# Patient Record
Sex: Female | Born: 1975 | Race: Black or African American | Hispanic: No | Marital: Single | Smoking: Current every day smoker
Health system: Southern US, Community
[De-identification: ages and names within clinical notes are randomized; demographics above are authoritative.]

## PROBLEM LIST (undated history)

## (undated) DIAGNOSIS — I10 Essential (primary) hypertension: Secondary | ICD-10-CM

## (undated) DIAGNOSIS — F419 Anxiety disorder, unspecified: Secondary | ICD-10-CM

## (undated) DIAGNOSIS — F32A Depression, unspecified: Secondary | ICD-10-CM

## (undated) HISTORY — DX: Depression, unspecified: F32.A

## (undated) HISTORY — DX: Anxiety disorder, unspecified: F41.9

---

## 2004-02-21 ENCOUNTER — Emergency Department: Payer: Self-pay | Admitting: General Practice

## 2004-06-11 ENCOUNTER — Emergency Department: Payer: Self-pay | Admitting: Emergency Medicine

## 2004-06-22 ENCOUNTER — Emergency Department: Payer: Self-pay | Admitting: Emergency Medicine

## 2004-06-23 ENCOUNTER — Ambulatory Visit: Payer: Self-pay | Admitting: Emergency Medicine

## 2004-07-03 ENCOUNTER — Emergency Department: Payer: Self-pay | Admitting: Emergency Medicine

## 2004-11-16 ENCOUNTER — Emergency Department: Payer: Self-pay | Admitting: Unknown Physician Specialty

## 2005-04-27 ENCOUNTER — Emergency Department: Payer: Self-pay | Admitting: Emergency Medicine

## 2005-04-27 ENCOUNTER — Other Ambulatory Visit: Payer: Self-pay

## 2005-09-27 ENCOUNTER — Emergency Department: Payer: Self-pay | Admitting: Internal Medicine

## 2005-09-28 ENCOUNTER — Ambulatory Visit: Payer: Self-pay | Admitting: Internal Medicine

## 2007-04-25 ENCOUNTER — Emergency Department: Payer: Self-pay | Admitting: Emergency Medicine

## 2015-04-07 LAB — HM PAP SMEAR: HM Pap smear: NEGATIVE

## 2016-09-18 ENCOUNTER — Encounter: Payer: Self-pay | Admitting: Obstetrics and Gynecology

## 2016-09-19 ENCOUNTER — Ambulatory Visit (INDEPENDENT_AMBULATORY_CARE_PROVIDER_SITE_OTHER): Payer: Self-pay | Admitting: Obstetrics and Gynecology

## 2016-09-19 DIAGNOSIS — Z5329 Procedure and treatment not carried out because of patient's decision for other reasons: Secondary | ICD-10-CM

## 2016-11-08 NOTE — Progress Notes (Signed)
No show

## 2017-08-16 ENCOUNTER — Ambulatory Visit: Payer: Self-pay | Admitting: Family Medicine

## 2017-08-16 DIAGNOSIS — Z0289 Encounter for other administrative examinations: Secondary | ICD-10-CM

## 2017-08-16 NOTE — Progress Notes (Deleted)
   Subjective:    Patient ID: Nancy Ritter, female    DOB: 05/20/1975, 42 y.o.   MRN: [id number]  HPI  Presents to clinic to establish primary care  Review of Systems  Constitutional: Negative for chills, fatigue and fever.  HENT: Negative for congestion, ear pain, sinus pain and sore throat.   Eyes: Negative.   Respiratory: Negative for cough, shortness of breath and wheezing.   Cardiovascular: Negative for chest pain, palpitations and leg swelling.  Gastrointestinal: Negative for abdominal pain, diarrhea, nausea and vomiting.  Genitourinary: Negative for dysuria, frequency and urgency.  Musculoskeletal: Negative for arthralgias and myalgias.  Skin: Negative for color change, pallor and rash.  Neurological: Negative for syncope, light-headedness and headaches.  Psychiatric/Behavioral: The patient is not nervous/anxious.       Objective:   Physical Exam  Constitutional: She is oriented to person, place, and time. She appears well-developed and well-nourished. No distress.  HENT:  Head: Normocephalic and atraumatic.  Eyes: Pupils are equal, round, and reactive to light. EOM are normal. No scleral icterus.  Neck: Normal range of motion. Neck supple. No tracheal deviation present.  Cardiovascular: Normal rate, regular rhythm and normal heart sounds.  Pulmonary/Chest: Effort normal and breath sounds normal. No respiratory distress. She has no wheezes. She has no rales.  Abdominal: Soft. Bowel sounds are normal. There is no tenderness.  Neurological: She is alert and oriented to person, place, and time.  Gait normal  Skin: Skin is warm and dry. No pallor.  Psychiatric: She has a normal mood and affect. Her behavior is normal. Thought content normal.  Nursing note and vitals reviewed.      Assessment & Plan:

## 2017-12-17 ENCOUNTER — Other Ambulatory Visit: Payer: Self-pay | Admitting: Certified Nurse Midwife

## 2017-12-17 DIAGNOSIS — Z1231 Encounter for screening mammogram for malignant neoplasm of breast: Secondary | ICD-10-CM

## 2018-01-07 ENCOUNTER — Inpatient Hospital Stay: Admission: RE | Admit: 2018-01-07 | Payer: Medicaid Other | Source: Ambulatory Visit

## 2020-07-28 ENCOUNTER — Emergency Department
Admission: EM | Admit: 2020-07-28 | Discharge: 2020-07-28 | Disposition: A | Discharge status: Home / Self Care | Payer: Self-pay | Attending: Emergency Medicine | Admitting: Emergency Medicine

## 2020-07-28 DIAGNOSIS — M549 Dorsalgia, unspecified: Secondary | ICD-10-CM | POA: Insufficient documentation

## 2020-07-28 DIAGNOSIS — F17210 Nicotine dependence, cigarettes, uncomplicated: Secondary | ICD-10-CM | POA: Insufficient documentation

## 2020-07-28 DIAGNOSIS — Y92410 Unspecified street and highway as the place of occurrence of the external cause: Secondary | ICD-10-CM | POA: Insufficient documentation

## 2020-07-28 DIAGNOSIS — R519 Headache, unspecified: Secondary | ICD-10-CM | POA: Insufficient documentation
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
## 2020-07-28 NOTE — ED Notes (Addendum)
Attempted to reassess vital signs, pt not in ED room 41 at present time. ED provider aware.

## 2020-07-30 ENCOUNTER — Emergency Department: Payer: No Typology Code available for payment source

## 2020-07-30 ENCOUNTER — Emergency Department
Admission: EM | Admit: 2020-07-30 | Discharge: 2020-07-30 | Disposition: A | Discharge status: Home / Self Care | Payer: No Typology Code available for payment source | Attending: Emergency Medicine | Admitting: Emergency Medicine

## 2020-07-30 ENCOUNTER — Other Ambulatory Visit: Payer: Self-pay

## 2020-07-30 DIAGNOSIS — I10 Essential (primary) hypertension: Secondary | ICD-10-CM | POA: Diagnosis not present

## 2020-07-30 DIAGNOSIS — I160 Hypertensive urgency: Secondary | ICD-10-CM | POA: Insufficient documentation

## 2020-07-30 DIAGNOSIS — Z79899 Other long term (current) drug therapy: Secondary | ICD-10-CM | POA: Insufficient documentation

## 2020-07-30 DIAGNOSIS — M545 Low back pain, unspecified: Secondary | ICD-10-CM | POA: Insufficient documentation

## 2020-07-30 DIAGNOSIS — M546 Pain in thoracic spine: Secondary | ICD-10-CM | POA: Insufficient documentation

## 2020-07-30 DIAGNOSIS — Y92410 Unspecified street and highway as the place of occurrence of the external cause: Secondary | ICD-10-CM | POA: Diagnosis not present

## 2020-07-30 DIAGNOSIS — F17210 Nicotine dependence, cigarettes, uncomplicated: Secondary | ICD-10-CM | POA: Diagnosis not present

## 2020-07-30 HISTORY — DX: Essential (primary) hypertension: I10

## 2020-07-30 LAB — CBC WITH DIFFERENTIAL/PLATELET
Abs Immature Granulocytes: 0.01 10*3/uL (ref 0.00–0.07)
Basophils Absolute: 0 10*3/uL (ref 0.0–0.1)
Basophils Relative: 1 %
Eosinophils Absolute: 0.1 10*3/uL (ref 0.0–0.5)
Eosinophils Relative: 2 %
HCT: 38.1 % (ref 36.0–46.0)
Hemoglobin: 14.2 g/dL (ref 12.0–15.0)
Immature Granulocytes: 0 %
Lymphocytes Relative: 35 %
Lymphs Abs: 1.6 10*3/uL (ref 0.7–4.0)
MCH: 31.2 pg (ref 26.0–34.0)
MCHC: 37.3 g/dL — ABNORMAL HIGH (ref 30.0–36.0)
MCV: 83.7 fL (ref 80.0–100.0)
Monocytes Absolute: 0.4 10*3/uL (ref 0.1–1.0)
Monocytes Relative: 7 %
Neutro Abs: 2.6 10*3/uL (ref 1.7–7.7)
Neutrophils Relative %: 55 %
Platelets: 230 10*3/uL (ref 150–400)
RBC: 4.55 MIL/uL (ref 3.87–5.11)
RDW: 13.6 % (ref 11.5–15.5)
WBC: 4.7 10*3/uL (ref 4.0–10.5)
nRBC: 0 % (ref 0.0–0.2)

## 2020-07-30 LAB — COMPREHENSIVE METABOLIC PANEL
ALT: 46 U/L — ABNORMAL HIGH (ref 0–44)
AST: 62 U/L — ABNORMAL HIGH (ref 15–41)
Albumin: 3.8 g/dL (ref 3.5–5.0)
Alkaline Phosphatase: 52 U/L (ref 38–126)
Anion gap: 10 (ref 5–15)
BUN: 8 mg/dL (ref 6–20)
CO2: 27 mmol/L (ref 22–32)
Calcium: 9 mg/dL (ref 8.9–10.3)
Chloride: 99 mmol/L (ref 98–111)
Creatinine, Ser: 0.59 mg/dL (ref 0.44–1.00)
GFR, Estimated: >60 mL/min (ref >60)
Glucose, Bld: 83 mg/dL (ref 70–99)
Potassium: 3.6 mmol/L (ref 3.5–5.1)
Sodium: 136 mmol/L (ref 135–145)
Total Bilirubin: 0.7 mg/dL (ref 0.3–1.2)
Total Protein: 7.3 g/dL (ref 6.5–8.1)

## 2020-07-30 LAB — POC URINE PREG, ED: Preg Test, Ur: NEGATIVE

## 2020-07-30 MED ORDER — MELOXICAM 7.5 MG PO TABS
15.0000 mg | ORAL_TABLET | Freq: Once | ORAL | Status: AC
  Administered 2020-07-30: 15 mg via ORAL

## 2020-07-30 MED ORDER — ACETAMINOPHEN 325 MG PO TABS
650.0000 mg | ORAL_TABLET | Freq: Once | ORAL | Status: AC
  Administered 2020-07-30: 650 mg via ORAL
  Filled 2020-07-30: qty 2

## 2020-07-30 MED ORDER — MELOXICAM 15 MG PO TABS: 15.0000 mg | ORAL_TABLET | Freq: Every day | ORAL | 0 refills | Status: AC

## 2020-07-30 MED ORDER — AMLODIPINE BESYLATE 5 MG PO TABS
5.0000 mg | ORAL_TABLET | Freq: Once | ORAL | Status: AC
  Administered 2020-07-30: 5 mg via ORAL
  Filled 2020-07-30: qty 1

## 2020-07-30 MED ORDER — METHOCARBAMOL 750 MG PO TABS: 750.0000 mg | ORAL_TABLET | Freq: Four times a day (QID) | ORAL | 0 refills | Status: AC | PRN

## 2020-07-30 MED ORDER — OXYCODONE-ACETAMINOPHEN 5-325 MG PO TABS
1.0000 | ORAL_TABLET | Freq: Once | ORAL | Status: AC
  Administered 2020-07-30: 1 via ORAL
  Filled 2020-07-30: qty 1

## 2020-07-30 MED ORDER — AMLODIPINE BESYLATE 5 MG PO TABS: 5.0000 mg | ORAL_TABLET | Freq: Every day | ORAL | 0 refills | Status: DC

## 2020-07-30 MED ORDER — METHOCARBAMOL 500 MG PO TABS
750.0000 mg | ORAL_TABLET | Freq: Once | ORAL | Status: AC
  Administered 2020-07-30: 750 mg via ORAL
  Filled 2020-07-30: qty 2

## 2020-07-30 NOTE — Discharge Instructions (Addendum)
You have been prescribed Robaxin, a muscle relaxer which you may take up to 4 times daily.  You have also been prescribed Mobic, an anti-inflammatory to take once daily.  You may safely combine these with Tylenol, up to 1000 mg 4 times daily as needed for pain.  In regards to your hypertension, you have been prescribed amlodipine to take once daily over the next 30 days.  Please schedule a follow-up with a primary care provider to discuss further care for your hypertension.  Return to the emergency department with any worsening of symptoms.
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
## 2020-11-25 ENCOUNTER — Other Ambulatory Visit: Payer: Self-pay

## 2020-11-25 ENCOUNTER — Observation Stay
Admission: EM | Admit: 2020-11-25 | Discharge: 2020-11-26 | Disposition: A | Discharge status: Home / Self Care | Payer: Self-pay | Attending: Emergency Medicine | Admitting: Emergency Medicine

## 2020-11-25 ENCOUNTER — Emergency Department: Payer: Self-pay

## 2020-11-25 ENCOUNTER — Encounter: Payer: Self-pay | Admitting: Emergency Medicine

## 2020-11-25 DIAGNOSIS — I160 Hypertensive urgency: Secondary | ICD-10-CM

## 2020-11-25 DIAGNOSIS — R7989 Other specified abnormal findings of blood chemistry: Secondary | ICD-10-CM

## 2020-11-25 DIAGNOSIS — I10 Essential (primary) hypertension: Secondary | ICD-10-CM

## 2020-11-25 DIAGNOSIS — R778 Other specified abnormalities of plasma proteins: Secondary | ICD-10-CM

## 2020-11-25 DIAGNOSIS — F17210 Nicotine dependence, cigarettes, uncomplicated: Secondary | ICD-10-CM | POA: Insufficient documentation

## 2020-11-25 DIAGNOSIS — R748 Abnormal levels of other serum enzymes: Secondary | ICD-10-CM | POA: Insufficient documentation

## 2020-11-25 DIAGNOSIS — R079 Chest pain, unspecified: Principal | ICD-10-CM | POA: Diagnosis present

## 2020-11-25 DIAGNOSIS — Z20822 Contact with and (suspected) exposure to covid-19: Secondary | ICD-10-CM | POA: Insufficient documentation

## 2020-11-25 HISTORY — DX: Other specified abnormal findings of blood chemistry: R79.89

## 2020-11-25 LAB — CBC
HCT: 42.4 % (ref 36.0–46.0)
HCT: 38 % (ref 36.0–46.0)
Hemoglobin: 15.5 g/dL — ABNORMAL HIGH (ref 12.0–15.0)
Hemoglobin: 14.1 g/dL (ref 12.0–15.0)
MCH: 30.7 pg (ref 26.0–34.0)
MCH: 31.1 pg (ref 26.0–34.0)
MCHC: 36.6 g/dL — ABNORMAL HIGH (ref 30.0–36.0)
MCHC: 37.1 g/dL — ABNORMAL HIGH (ref 30.0–36.0)
MCV: 84 fL (ref 80.0–100.0)
MCV: 83.7 fL (ref 80.0–100.0)
Platelets: 265 10*3/uL (ref 150–400)
Platelets: 238 10*3/uL (ref 150–400)
RBC: 5.05 MIL/uL (ref 3.87–5.11)
RBC: 4.54 MIL/uL (ref 3.87–5.11)
RDW: 12.6 % (ref 11.5–15.5)
RDW: 12.4 % (ref 11.5–15.5)
WBC: 6.5 10*3/uL (ref 4.0–10.5)
WBC: 5 10*3/uL (ref 4.0–10.5)
nRBC: 0 % (ref 0.0–0.2)
nRBC: 0 % (ref 0.0–0.2)

## 2020-11-25 LAB — BASIC METABOLIC PANEL
Anion gap: 6 (ref 5–15)
BUN: 10 mg/dL (ref 6–20)
CO2: 26 mmol/L (ref 22–32)
Calcium: 9.2 mg/dL (ref 8.9–10.3)
Chloride: 103 mmol/L (ref 98–111)
Creatinine, Ser: 0.73 mg/dL (ref 0.44–1.00)
GFR, Estimated: >60 mL/min (ref >60)
Glucose, Bld: 95 mg/dL (ref 70–99)
Potassium: 4.1 mmol/L (ref 3.5–5.1)
Sodium: 135 mmol/L (ref 135–145)

## 2020-11-25 LAB — RESP PANEL BY RT-PCR (FLU A&B, COVID) ARPGX2
Influenza A by PCR: NEGATIVE
Influenza B by PCR: NEGATIVE
SARS Coronavirus 2 by RT PCR: NEGATIVE

## 2020-11-25 LAB — CREATININE, SERUM
Creatinine, Ser: 0.65 mg/dL (ref 0.44–1.00)
GFR, Estimated: >60 mL/min (ref >60)

## 2020-11-25 LAB — TROPONIN I (HIGH SENSITIVITY)
Troponin I (High Sensitivity): 7 ng/L (ref <18)
Troponin I (High Sensitivity): 19 ng/L — ABNORMAL HIGH (ref <18)
Troponin I (High Sensitivity): 11 ng/L (ref <18)

## 2020-11-25 MED ORDER — ASPIRIN EC 81 MG PO TBEC
81.0000 mg | DELAYED_RELEASE_TABLET | Freq: Every day | ORAL | Status: DC
  Administered 2020-11-26: 81 mg via ORAL
  Filled 2020-11-25: qty 1

## 2020-11-25 MED ORDER — AMLODIPINE BESYLATE 5 MG PO TABS
5.0000 mg | ORAL_TABLET | Freq: Every day | ORAL | Status: DC
  Administered 2020-11-26: 5 mg via ORAL
  Filled 2020-11-25: qty 1

## 2020-11-25 MED ORDER — CLONIDINE HCL 0.1 MG PO TABS
0.2000 mg | ORAL_TABLET | Freq: Once | ORAL | Status: AC
  Administered 2020-11-25: 0.2 mg via ORAL
  Filled 2020-11-25: qty 2

## 2020-11-25 MED ORDER — ENOXAPARIN SODIUM 40 MG/0.4ML IJ SOSY
40.0000 mg | PREFILLED_SYRINGE | INTRAMUSCULAR | Status: DC
  Administered 2020-11-26: 40 mg via SUBCUTANEOUS
  Filled 2020-11-25: qty 0.4

## 2020-11-25 MED ORDER — NITROGLYCERIN 0.4 MG SL SUBL: 0.4000 mg | SUBLINGUAL_TABLET | SUBLINGUAL | Status: DC | PRN

## 2020-11-25 MED ORDER — ASPIRIN 81 MG PO CHEW
162.0000 mg | CHEWABLE_TABLET | Freq: Once | ORAL | Status: AC
  Administered 2020-11-25: 162 mg via ORAL
  Filled 2020-11-25: qty 2

## 2020-11-25 NOTE — ED Provider Notes (Signed)
Emergency Medicine Provider Triage Evaluation Note  Nancy Ritter , a 45 y.o. female  was evaluated in triage.  Pt complains of chest pain and numbness/tingling in left arm.  Review of Systems  Positive: Chest pain, numbness tingling in left arm Negative: No fever, chills, cough or congestion, no vomiting/diarrhea  Physical Exam  BP (!) 183/130 (BP Location: Left Arm)   Pulse 81   Temp 98.5 F (36.9 C) (Oral)   Resp 20   Ht 5\' 6"  (1.676 m)   Wt 63.5 kg   LMP 11/03/2020   SpO2 98%   BMI 22.60 kg/m  Gen:   Awake, no distress   Resp:  Normal effort  MSK:   Moves extremities without difficulty  Other:    Medical Decision Making  Medically screening exam initiated at 1:00 PM.  Appropriate orders placed.  13/03/2020 was informed that the remainder of the evaluation will be completed by another provider, this initial triage assessment does not replace that evaluation, and the importance of remaining in the ED until their evaluation is complete.     [name], PA-C 11/25/20 1300    11/27/20, MD 11/25/20 1451
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
## 2020-11-25 NOTE — ED Triage Notes (Signed)
Pt reports her BP has been running high and she is having intermittent CP that is sharp in nature and makes her left arm numb and tingly

## 2020-11-25 NOTE — H&P (Signed)
History and Physical  Nancy Ritter [id number] DOB: 1975-01-06 DOA: 11/25/2020  Referring physician: Arta Silence, MD PCP: Pcp, No  Patient coming from: Home  Chief Complaint: Chest pain and elevated BP  HPI: Nancy Ritter is a 45 y.o. female with medical history significant for hypertension (not on any medication) who presents to the emergency department due to midsternal, nonreproducible sharp chest pain with radiation to left arm, it was rated as 8/10 on pain scale which started this morning prior to going to work, chest pain was alleviated with sitting still and taking deep breaths, chest pain subsequently resolved and she went to work, she had a recurrence of same chest pain just about clocking in at work and this was also relieved with sitting still and taking deep breath, unfortunately, this occurred the 3rd time and that BP was checked at work and noted to be elevated, EMS was activated and patient was taken to the ED for further evaluation and management.  ED Course:  In the emergency department, BP was elevated at 186/111, but other vital signs were within normal range.  Work-up in the ED showed normal CBC except for elevated hemoglobin at 15.5, normal BMP, troponin x2 - 7 > 19. Chest x-ray showed no acute intrathoracic process.  Aspirin 160 mg x 1 was given, clonidine 0.2 mg x 1 was given.  Chest pain already resolved.  Hospitalist was asked to admit patient for further evaluation and management.  Review of Systems: Constitutional: Negative for chills and fever.  HENT: Negative for ear pain and sore throat.   Eyes: Negative for pain and visual disturbance.  Respiratory: Negative for cough, chest tightness and shortness of breath.   Cardiovascular: Positive for chest pain and negative for palpitations.  Gastrointestinal: Negative for abdominal pain and vomiting.  Endocrine: Negative for polyphagia and polyuria.  Genitourinary: Negative for decreased urine volume, dysuria,  enuresis Musculoskeletal: Negative for arthralgias and back pain.  Skin: Negative for color change and rash.  Allergic/Immunologic: Negative for immunocompromised state.  Neurological: Negative for tremors, syncope, speech difficulty, weakness, light-headedness  Hematological: Does not bruise/bleed easily.  All other systems reviewed and are negative   Past Medical History:  Diagnosis Date   Hypertension    History reviewed. No pertinent surgical history.  Social History:  reports that she has been smoking cigarettes. She has never used smokeless tobacco. She reports current alcohol use. No history on file for drug use.   No Known Allergies  Family history: Mother: Deceased, had a history of stroke and hypertension Father: Alive, has a history of hypertension  Prior to Admission medications   Medication Sig Start Date End Date Taking? Authorizing Provider  amLODipine (NORVASC) 5 MG tablet Take 1 tablet (5 mg total) by mouth daily. 07/30/20 08/29/20  Marlana Salvage, PA    Physical Exam: BP 140/87 (BP Location: Left Arm)   Pulse 60   Temp 98.4 F (36.9 C) (Oral)   Resp 18   Ht 5\' 6"  (1.676 m)   Wt 63.5 kg   LMP 11/03/2020   SpO2 100%   BMI 22.60 kg/m   General: 45 y.o. year-old female well developed well nourished in no acute distress.  Alert and oriented x3. HEENT: NCAT, EOMI Neck: Supple, trachea medial Cardiovascular: Regular rate and rhythm with no rubs or gallops.  No thyromegaly or JVD noted.  No lower extremity edema. 2/4 pulses in all 4 extremities. Respiratory: Clear to auscultation with no wheezes or rales. Good inspiratory effort. Abdomen:  Soft, nontender nondistended with normal bowel sounds x4 quadrants. Muskuloskeletal: No cyanosis, clubbing or edema noted bilaterally Neuro: CN II-XII intact, strength 5/5 x 4, sensation, reflexes intact Skin: No ulcerative lesions noted or rashes Psychiatry: Judgement and insight appear normal. Mood is appropriate for  condition and setting          Labs on Admission:  Basic Metabolic Panel: Recent Labs  Lab 11/25/20 1316  NA 135  K 4.1  CL 103  CO2 26  GLUCOSE 95  BUN 10  CREATININE 0.73  CALCIUM 9.2   Liver Function Tests: No results for input(s): AST, ALT, ALKPHOS, BILITOT, PROT, ALBUMIN in the last 168 hours. No results for input(s): LIPASE, AMYLASE in the last 168 hours. No results for input(s): AMMONIA in the last 168 hours. CBC: Recent Labs  Lab 11/25/20 1316  WBC 6.5  HGB 15.5*  HCT 42.4  MCV 84.0  PLT 265   Cardiac Enzymes: No results for input(s): CKTOTAL, CKMB, CKMBINDEX, TROPONINI in the last 168 hours.  BNP (last 3 results) No results for input(s): BNP in the last 8760 hours.  ProBNP (last 3 results) No results for input(s): PROBNP in the last 8760 hours.  CBG: No results for input(s): GLUCAP in the last 168 hours.  Radiological Exams on Admission: DG Chest 2 View  Result Date: 11/25/2020 CLINICAL DATA:  Chest pain EXAM: CHEST - 2 VIEW COMPARISON:  Chest x-ray 04/27/2005 FINDINGS: Heart size and mediastinal contours are within normal limits. No suspicious pulmonary opacities identified. No pleural effusion or pneumothorax visualized. No acute osseous abnormality appreciated. IMPRESSION: No acute intrathoracic process identified. Electronically Signed   By: Jannifer Hick M.D.   On: 11/25/2020 13:30    EKG: I independently viewed the EKG done and my findings are as followed: Normal sinus rhythm at a rate of 77 bpm  Assessment/Plan Present on Admission:  Chest pain  Principal Problem:   Chest pain   Chest pain rule out ACS Cardiovascular risk factors include hypertension Elevated troponin possibly due to to type II demand ischemia Continue telemetry  Troponins x2 -7 > 19, continue to trend troponin EKG showed normal sinus rhythm  Cardiology will be consulted to help decide if Stress test is needed in am Versus other diagnostic modalities.    Give  aspirin, nitroglycerin prn  Hypertensive urgency Essential hypertension (uncontrolled) Patient with history of hypertension but was not taking any medication for BP, possibly due to not having a PCP Clonidine 0.2 mg x 1 was given Continue amlodipine 5 mg p.o. daily  DVT prophylaxis: Lovenox  Code Status: Full code  Family Communication: None at bedside  Disposition Plan:  Patient is from:                        home Anticipated DC to:                   SNF or family members home Anticipated DC date:               2-3 days Anticipated DC barriers:          Patient requires inpatient management due to chest pain requiring pending cardiology consult  Consults called: Cardiology  Admission status:      Frankey Shown MD [facility]  11/25/2020, 10:04 PM

## 2020-11-26 ENCOUNTER — Encounter: Payer: Self-pay | Admitting: Internal Medicine

## 2020-11-26 LAB — CBC
HCT: 39.2 % (ref 36.0–46.0)
Hemoglobin: 14.3 g/dL (ref 12.0–15.0)
MCH: 30 pg (ref 26.0–34.0)
MCHC: 36.5 g/dL — ABNORMAL HIGH (ref 30.0–36.0)
MCV: 82.4 fL (ref 80.0–100.0)
Platelets: 248 10*3/uL (ref 150–400)
RBC: 4.76 MIL/uL (ref 3.87–5.11)
RDW: 12.3 % (ref 11.5–15.5)
WBC: 4.9 10*3/uL (ref 4.0–10.5)
nRBC: 0 % (ref 0.0–0.2)

## 2020-11-26 LAB — LIPID PANEL
Cholesterol: 185 mg/dL (ref 0–200)
HDL: 44 mg/dL (ref >40)
LDL Cholesterol: 126 mg/dL — ABNORMAL HIGH (ref 0–99)
Total CHOL/HDL Ratio: 4.2 RATIO
Triglycerides: 73 mg/dL (ref <150)
VLDL: 15 mg/dL (ref 0–40)

## 2020-11-26 LAB — COMPREHENSIVE METABOLIC PANEL
ALT: 11 U/L (ref 0–44)
AST: 13 U/L — ABNORMAL LOW (ref 15–41)
Albumin: 3.5 g/dL (ref 3.5–5.0)
Alkaline Phosphatase: 41 U/L (ref 38–126)
Anion gap: 6 (ref 5–15)
BUN: 9 mg/dL (ref 6–20)
CO2: 25 mmol/L (ref 22–32)
Calcium: 9 mg/dL (ref 8.9–10.3)
Chloride: 101 mmol/L (ref 98–111)
Creatinine, Ser: 0.65 mg/dL (ref 0.44–1.00)
GFR, Estimated: >60 mL/min (ref >60)
Glucose, Bld: 90 mg/dL (ref 70–99)
Potassium: 3.4 mmol/L — ABNORMAL LOW (ref 3.5–5.1)
Sodium: 132 mmol/L — ABNORMAL LOW (ref 135–145)
Total Bilirubin: 0.7 mg/dL (ref 0.3–1.2)
Total Protein: 6.6 g/dL (ref 6.5–8.1)

## 2020-11-26 LAB — APTT: aPTT: 34 seconds (ref 24–36)

## 2020-11-26 LAB — PHOSPHORUS: Phosphorus: 3.8 mg/dL (ref 2.5–4.6)

## 2020-11-26 LAB — TSH: TSH: 1.895 u[IU]/mL (ref 0.350–4.500)

## 2020-11-26 LAB — TROPONIN I (HIGH SENSITIVITY): Troponin I (High Sensitivity): 10 ng/L (ref <18)

## 2020-11-26 LAB — HIV ANTIBODY (ROUTINE TESTING W REFLEX): HIV Screen 4th Generation wRfx: NONREACTIVE

## 2020-11-26 LAB — MAGNESIUM: Magnesium: 1.9 mg/dL (ref 1.7–2.4)

## 2020-11-26 MED ORDER — AMLODIPINE BESYLATE 5 MG PO TABS: 5.0000 mg | ORAL_TABLET | Freq: Every day | ORAL | 3 refills | Status: DC

## 2020-11-26 NOTE — ED Notes (Signed)
Patient denies pain and is resting comfortably.  

## 2020-11-26 NOTE — Discharge Summary (Signed)
Physician Discharge Summary   Patient name: Nancy Ritter  Admit date:     11/25/2020  Discharge date: 11/26/2020  Discharge Physician: Lewie Chamber   PCP: Pcp, No   Recommendations at discharge:  Follow up with cardiology Establish with primary care Follow up A1c  Discharge Diagnoses Principal Problem:   Chest pain Active Problems:   Hypertensive urgency   Essential hypertension   Elevated troponin   Resolved Diagnoses Resolved Problems:   * No resolved hospital problems. [facility] Course   Nancy Ritter is a 45 yo female with PMH HTN who presented to the ER with CP and elevated BP. She is on no meds at home PTA and was found to have BP 186/111.  She was admitted for workup.  EKG showed subtle changes likely from her uncontrolled BP. Troponin was trended and remained negative from an ACS standpoint. Her BP responded to clonidine on admission and she was continued on amlodipine at discharge. SW was consulted for assistance with outpatient PCP arrangement and she was also evaluated by cardiology inpatient and will follow up outpatient as well.     Procedures performed: n/a   Condition at discharge: stable  Exam Physical Exam Constitutional:      Appearance: Normal appearance.  HENT:     Head: Normocephalic and atraumatic.     Mouth/Throat:     Mouth: Mucous membranes are moist.  Eyes:     Extraocular Movements: Extraocular movements intact.  Cardiovascular:     Rate and Rhythm: Normal rate and regular rhythm.     Heart sounds: Normal heart sounds.  Pulmonary:     Effort: Pulmonary effort is normal. No respiratory distress.     Breath sounds: Normal breath sounds.  Abdominal:     General: Bowel sounds are normal. There is no distension.     Palpations: Abdomen is soft.     Tenderness: There is no abdominal tenderness.  Musculoskeletal:        General: Normal range of motion.     Cervical back: Normal range of motion and neck supple.  Skin:    General:  Skin is warm and dry.  Neurological:     General: No focal deficit present.     Mental Status: She is alert.  Psychiatric:        Mood and Affect: Mood normal.        Behavior: Behavior normal.     Disposition: Home  Discharge time: greater than 30 minutes.  Follow-up Information     Paraschos, Alexander, MD. Schedule an appointment as soon as possible for a visit in 1 week(s).   Specialty: Cardiology Contact information: [address] [facility] [place] [address] [phone]                 Allergies as of 11/26/2020   No Known Allergies      Medication List     TAKE these medications    amLODipine 5 MG tablet Commonly known as: NORVASC Take 1 tablet (5 mg total) by mouth daily.        DG Chest 2 View  Result Date: 11/25/2020 CLINICAL DATA:  Chest pain EXAM: CHEST - 2 VIEW COMPARISON:  Chest x-ray 04/27/2005 FINDINGS: Heart size and mediastinal contours are within normal limits. No suspicious pulmonary opacities identified. No pleural effusion or pneumothorax visualized. No acute osseous abnormality appreciated. IMPRESSION: No acute intrathoracic process identified. Electronically Signed   By: Philis Kendall.D.  On: 11/25/2020 13:30   Results for orders placed or performed during the hospital encounter of 11/25/20  Resp Panel by RT-PCR (Flu A&B, Covid) Nasopharyngeal Swab     Status: None   Collection Time: 11/25/20 10:50 PM   Specimen: Nasopharyngeal Swab; Nasopharyngeal(NP) swabs in vial transport medium  Result Value Ref Range Status   SARS Coronavirus 2 by RT PCR NEGATIVE NEGATIVE Final    Comment: (NOTE) SARS-CoV-2 target nucleic acids are NOT DETECTED.  The SARS-CoV-2 RNA is generally detectable in upper respiratory specimens during the acute phase of infection. The lowest concentration of SARS-CoV-2 viral copies this assay can detect is 138 copies/mL. A negative result does not preclude  SARS-Cov-2 infection and should not be used as the sole basis for treatment or other patient management decisions. A negative result may occur with  improper specimen collection/handling, submission of specimen other than nasopharyngeal swab, presence of viral mutation(s) within the areas targeted by this assay, and inadequate number of viral copies(<138 copies/mL). A negative result must be combined with clinical observations, patient history, and epidemiological information. The expected result is Negative.  Fact Sheet for Patients:  BloggerCourse.com  Fact Sheet for Healthcare Providers:  SeriousBroker.it  This test is no t yet approved or cleared by the [place] FDA and  has been authorized for detection and/or diagnosis of SARS-CoV-2 by FDA under an Emergency Use Authorization (EUA). This EUA will remain  in effect (meaning this test can be used) for the duration of the COVID-19 declaration under Section 564(b)(1) of the Act, 21 U.S.C.section 360bbb-3(b)(1), unless the authorization is terminated  or revoked sooner.       Influenza A by PCR NEGATIVE NEGATIVE Final   Influenza B by PCR NEGATIVE NEGATIVE Final    Comment: (NOTE) The Xpert Xpress SARS-CoV-2/FLU/RSV plus assay is intended as an aid in the diagnosis of influenza from Nasopharyngeal swab specimens and should not be used as a sole basis for treatment. Nasal washings and aspirates are unacceptable for Xpert Xpress SARS-CoV-2/FLU/RSV testing.  Fact Sheet for Patients: BloggerCourse.com  Fact Sheet for Healthcare Providers: SeriousBroker.it  This test is not yet approved or cleared by the [place] FDA and has been authorized for detection and/or diagnosis of SARS-CoV-2 by FDA under an Emergency Use Authorization (EUA). This EUA will remain in effect (meaning this test can be used) for the duration of  the COVID-19 declaration under Section 564(b)(1) of the Act, 21 U.S.C. section 360bbb-3(b)(1), unless the authorization is terminated or revoked.  Performed at [facility], [address] [place], [place] [address]     Signed:  Lewie Chamber MD.  [facility] 11/26/2020, 11:03 AM

## 2020-11-26 NOTE — TOC Transition Note (Signed)
Transition of Care [facility]) - CM/SW Discharge Note   Patient Details  Name: Nancy Ritter MRN: [id number] Date of Birth: 03/17/1975  [facility] CM/SW Contact:  Shelbie Hutching, RN Phone Number: 11/26/2020, 10:12 AM   Clinical Narrative:    Patient came into the hospital with chest pain and hypertension.  Patient cleared by cardiology and recommended to follow up for OP stress test next week.  Patient will discharge home.  RNCM met with patient at the bedside.  TOC consult for PCP needs.  Patient works at [employer], her daughter provides her transportation to work, her daughter will be picking her up from the hospital today.   Patient does not have a primary care doctor and has no insurance.  RNCM provided patient with list of community health clinics in [facility] including [facility].  Application for [facility] given to patient to fill out and return and referral sent to [facility].  Patient will get her prescription today filled at [facility], amlodipine is $9.     Final next level of care: Home/Self Care Barriers to Discharge: Barriers Resolved   Patient Goals and CMS Choice Patient states their goals for this hospitalization and ongoing recovery are:: Patient glad to be going home, wants to know when she can return to work      Discharge Placement                       Discharge Plan and Services   Discharge Planning Services: CM Consult, Medication Assistance, [facility]            DME Arranged: N/A DME Agency: NA       HH Arranged: NA [place] Agency: NA        Social Determinants of Health (SDOH) Interventions     Readmission Risk Interventions No flowsheet data found.

## 2020-11-26 NOTE — Consult Note (Signed)
[facility] Cardiology  CARDIOLOGY CONSULT NOTE  Patient ID: Nancy Ritter MRN: [id number] DOB/AGE: 02-10-1975 45 y.o.  Admit date: 11/25/2020 Referring Physician [place] Primary Physician  Primary Cardiologist  Reason for Consultation chest pain  HPI: 45 year old female referred for evaluation of chest pain.  Patient has a history of essential hypertension and tobacco abuse.  On 11/25/2020, the patient developed substernal sharp pain, alleviated by deep breaths.  The patient went to work appearance recurrence of chest pain, EMS was called and the patient was taken to [facility] ED.  ECG revealed sinus rhythm without acute ischemic ST-T wave changes.  Admission labs notable for normal high-sensitivity troponin (7, 19, 11, 10).  Patient was hypertensive, with blood pressure 186/111, treated with clonidine, and started on amlodipine, with normalization of blood pressure and resolution of chest pain.  Review of systems complete and found to be negative unless listed above     Past Medical History:  Diagnosis Date   Hypertension     History reviewed. No pertinent surgical history.  (Not in a hospital admission)  Social History   Socioeconomic History   Marital status: Single    Spouse name: Not on file   Number of children: Not on file   Years of education: Not on file   Highest education level: Not on file  Occupational History   Not on file  Tobacco Use   Smoking status: Every Day    Types: Cigarettes   Smokeless tobacco: Never  Vaping Use   Vaping Use: Never used  Substance and Sexual Activity   Alcohol use: Yes   Drug use: Not on file   Sexual activity: Not on file  Other Topics Concern   Not on file  Social History Narrative   Not on file   Social Determinants of Health   Financial Resource Strain: Not on file  Food Insecurity: Not on file  Transportation Needs: Not on file  Physical Activity: Not on file  Stress: Not on file  Social Connections: Not on file  Intimate  Partner Violence: Not on file    No family history on file.    Review of systems complete and found to be negative unless listed above      PHYSICAL EXAM  General: Well developed, well nourished, in no acute distress HEENT:  Normocephalic and atramatic Neck:  No JVD.  Lungs: Clear bilaterally to auscultation and percussion. Heart: HRRR . Normal S1 and S2 without gallops or murmurs.  Abdomen: Bowel sounds are positive, abdomen soft and non-tender  Msk:  Back normal, normal gait. Normal strength and tone for age. Extremities: No clubbing, cyanosis or edema.   Neuro: Alert and oriented X 3. Psych:  Good affect, responds appropriately  Labs:   Lab Results  Component Value Date   WBC 4.9 11/26/2020   HGB 14.3 11/26/2020   HCT 39.2 11/26/2020   MCV 82.4 11/26/2020   PLT 248 11/26/2020    Recent Labs  Lab 11/26/20 0711  NA 132*  K 3.4*  CL 101  CO2 25  BUN 9  CREATININE 0.65  CALCIUM 9.0  PROT 6.6  BILITOT 0.7  ALKPHOS 41  ALT 11  AST 13*  GLUCOSE 90   No results found for: CKTOTAL, CKMB, CKMBINDEX, TROPONINI No results found for: CHOL No results found for: HDL No results found for: LDLCALC No results found for: TRIG No results found for: CHOLHDL No results found for: LDLDIRECT    Radiology: DG Chest 2 View  Result Date:  11/25/2020 CLINICAL DATA:  Chest pain EXAM: CHEST - 2 VIEW COMPARISON:  Chest x-ray 04/27/2005 FINDINGS: Heart size and mediastinal contours are within normal limits. No suspicious pulmonary opacities identified. No pleural effusion or pneumothorax visualized. No acute osseous abnormality appreciated. IMPRESSION: No acute intrathoracic process identified. Electronically Signed   By: Jannifer Hick M.D.   On: 11/25/2020 13:30    EKG: Sinus rhythm 77 bpm  ASSESSMENT AND PLAN:   1.  Chest pain, with typical and atypical features, in the setting of markedly elevated blood pressure, normal high-sensitivity troponin, nondiagnostic ECG 2.   Essential hypertension, blood pressure improved on amlodipine 3.  Tobacco abuse  Recommendations  1.  Agree with current therapy 2.  Continue amlodipine for BP control 3.  The patient may be discharged home, follow-up next week with me for outpatient stress test 4.  Strongly advised patient to stop smoking  Signed: Marcina Millard MD,PhD, [facility] 11/26/2020, 8:57 AM

## 2020-11-26 NOTE — Hospital Course (Signed)
Nancy Ritter is a 45 yo female with PMH HTN who presented to the ER with CP and elevated BP. She is on no meds at home PTA and was found to have BP 186/111.  She was admitted for workup.  EKG showed subtle changes likely from her uncontrolled BP. Troponin was trended and remained negative from an ACS standpoint. Her BP responded to clonidine on admission and she was continued on amlodipine at discharge. SW was consulted for assistance with outpatient PCP arrangement and she was also evaluated by cardiology inpatient and will follow up outpatient as well.

## 2020-11-28 LAB — HEMOGLOBIN A1C
Hgb A1c MFr Bld: 5.2 % (ref 4.8–5.6)
Mean Plasma Glucose: 103 mg/dL

## 2020-12-14 ENCOUNTER — Telehealth: Payer: Self-pay | Admitting: Pharmacy Technician

## 2020-12-14 NOTE — Telephone Encounter (Signed)
Provided patient with new patient packet to obtain [facility] services.  [facility] must receive requested financial documentation in order to determine eligibility and provide medication assistance.  Sherilyn Dacosta Care Manager [facility]

## 2021-03-05 ENCOUNTER — Encounter: Payer: Self-pay | Admitting: Emergency Medicine

## 2021-03-05 ENCOUNTER — Other Ambulatory Visit: Payer: Self-pay

## 2021-03-05 ENCOUNTER — Emergency Department
Admission: EM | Admit: 2021-03-05 | Discharge: 2021-03-05 | Disposition: A | Discharge status: Home / Self Care | Payer: Self-pay | Attending: Emergency Medicine | Admitting: Emergency Medicine

## 2021-03-05 ENCOUNTER — Emergency Department: Payer: Self-pay

## 2021-03-05 DIAGNOSIS — R079 Chest pain, unspecified: Secondary | ICD-10-CM

## 2021-03-05 DIAGNOSIS — Z79899 Other long term (current) drug therapy: Secondary | ICD-10-CM | POA: Insufficient documentation

## 2021-03-05 DIAGNOSIS — R200 Anesthesia of skin: Secondary | ICD-10-CM | POA: Insufficient documentation

## 2021-03-05 DIAGNOSIS — I10 Essential (primary) hypertension: Secondary | ICD-10-CM | POA: Insufficient documentation

## 2021-03-05 LAB — BASIC METABOLIC PANEL
Anion gap: 6 (ref 5–15)
BUN: 13 mg/dL (ref 6–20)
CO2: 28 mmol/L (ref 22–32)
Calcium: 9.2 mg/dL (ref 8.9–10.3)
Chloride: 100 mmol/L (ref 98–111)
Creatinine, Ser: 0.85 mg/dL (ref 0.44–1.00)
GFR, Estimated: >60 mL/min (ref >60)
Glucose, Bld: 113 mg/dL — ABNORMAL HIGH (ref 70–99)
Potassium: 3.6 mmol/L (ref 3.5–5.1)
Sodium: 134 mmol/L — ABNORMAL LOW (ref 135–145)

## 2021-03-05 LAB — CBC
HCT: 41.2 % (ref 36.0–46.0)
Hemoglobin: 14.8 g/dL (ref 12.0–15.0)
MCH: 29.4 pg (ref 26.0–34.0)
MCHC: 35.9 g/dL (ref 30.0–36.0)
MCV: 81.9 fL (ref 80.0–100.0)
Platelets: 233 10*3/uL (ref 150–400)
RBC: 5.03 MIL/uL (ref 3.87–5.11)
RDW: 13.2 % (ref 11.5–15.5)
WBC: 5.2 10*3/uL (ref 4.0–10.5)
nRBC: 0 % (ref 0.0–0.2)

## 2021-03-05 LAB — TROPONIN I (HIGH SENSITIVITY): Troponin I (High Sensitivity): 13 ng/L (ref <18)

## 2021-03-05 MED ORDER — AMLODIPINE BESYLATE 5 MG PO TABS: 5.0000 mg | ORAL_TABLET | Freq: Every day | ORAL | 1 refills | Status: DC

## 2021-03-05 MED ORDER — METOPROLOL SUCCINATE ER 25 MG PO TB24: 25.0000 mg | ORAL_TABLET | Freq: Every day | ORAL | 1 refills | Status: DC

## 2021-03-05 MED ORDER — AMLODIPINE BESYLATE 5 MG PO TABS
5.0000 mg | ORAL_TABLET | Freq: Once | ORAL | Status: AC
  Administered 2021-03-05: 5 mg via ORAL
  Filled 2021-03-05: qty 1

## 2021-03-05 MED ORDER — METOPROLOL TARTRATE 25 MG PO TABS
25.0000 mg | ORAL_TABLET | Freq: Once | ORAL | Status: AC
  Administered 2021-03-05: 25 mg via ORAL
  Filled 2021-03-05: qty 1

## 2021-03-05 NOTE — ED Provider Notes (Signed)
? ?[facility] ?Provider Note ? ? ? Event Date/Time  ? First MD Initiated Contact with Patient 03/05/21 1508   ?  (approximate) ? ? ?History  ? ?Chest Pain ? ? ?HPI ? ?Nancy Ritter is a 46 y.o. female  who, per cardiology note dated 12/05/2020 was being seen for blood pressure and chest pain, who presents to the emergency department today because of concern for chest pain.  Patient states it is happened the past 3 mornings.  It does typically occur shortly after she wakes up and last until about noon.  It is intermittent.  Located in the left side of her chest.  She does get some associated left hand numbness.  The time my exam the pain has gone away.  She states she has been off her blood pressure medications for the past few weeks.  She has had similar pain in the past.  ? ? ?Physical Exam  ? ?Triage Vital Signs: ?ED Triage Vitals  ?Enc Vitals Group  ?   BP 03/05/21 1453 (!) 182/121  ?   Pulse Rate 03/05/21 1453 89  ?   Resp 03/05/21 1453 16  ?   Temp 03/05/21 1453 98.6 ?F (37 ?C)  ?   Temp Source 03/05/21 1453 Oral  ?   SpO2 03/05/21 1453 99 %  ?   Weight 03/05/21 1452 130 lb (59 kg)  ?   Height 03/05/21 1452 5\' 6"  (1.676 m)  ?   Head Circumference --   ?   Peak Flow --   ?   Pain Score 03/05/21 1451 5  ?   Pain Loc --   ?   Pain Edu? --   ?   Excl. in GC? --   ? ? ?Most recent vital signs: ?Vitals:  ? 03/05/21 1453  ?BP: (!) 182/121  ?Pulse: 89  ?Resp: 16  ?Temp: 98.6 ?F (37 ?C)  ?SpO2: 99%  ? ? ?General: Awake, no distress.  ?CV:  Good peripheral perfusion. Regular rate and rhythm ?Resp:  Normal effort. Lungs clear to auscultation ?Abd:  No distention. No tenderness to palpation. ?MSK:  No lower extremity edema.  ? ? ?ED Results / Procedures / Treatments  ? ?Labs ?(all labs ordered are listed, but only abnormal results are displayed) ?Labs Reviewed  ?BASIC METABOLIC PANEL - Abnormal; Notable for the following components:  ?    Result Value  ? Sodium 134 (*)   ? Glucose, Bld 113 (*)   ? All  other components within normal limits  ?CBC  ?TROPONIN I (HIGH SENSITIVITY)  ?TROPONIN I (HIGH SENSITIVITY)  ? ? ? ?EKG ? ?I05/05/23, attending physician, personally viewed and interpreted this EKG ? ?EKG Time: 1447 ?Rate: 81 ?Rhythm: normal sinus rhythm ?Axis: normal ?Intervals: qtc 427 ?QRS: narrow, q waves v1, v2 ?ST changes: no st elevation ?Impression: abnormal ekg ? ?RADIOLOGY ?I independently interpreted and visualized the CXR. My interpretation: No pneumonia. No pneumothorax. ?Radiology interpretation:  ?  ?IMPRESSION:  ?No active cardiopulmonary disease.  ? ? ? ?PROCEDURES: ? ?Critical Care performed: No ? ?Procedures ? ? ?MEDICATIONS ORDERED IN ED: ?Medications - No data to display ? ? ?IMPRESSION / MDM / ASSESSMENT AND PLAN / ED COURSE  ?I reviewed the triage vital signs and the nursing notes. ?             ?               ? ?Differential diagnosis includes, but  is not limited to, ACS, pneumonia, pneumothorax, dissection, high blood pressure. ? ?Patient presented to the emergency department today because of concerns for chest pain in the setting of blood pressure medication patient was found to be hypertensive here in the emergency department.  Blood work without any elevation of her troponin.  Chest x-ray without thorax.  Patient did feel better after blood pressure medications.  Evaluation was considered given chest pain and given the fact that the patient's troponin was negative and patient clinically feels better I think is reasonable for patient be discharged home. Will give patient prescription to refill her blood pressure medications. ? ? ?FINAL CLINICAL IMPRESSION(S) / ED DIAGNOSES  ? ?Final diagnoses:  ?Nonspecific chest pain  ?Hypertension, unspecified type  ? ? ? ?Rx / DC Orders  ? ?ED Discharge Orders   ? ?      Ordered  ?  metoprolol succinate (TOPROL XL) 25 MG 24 hr tablet  Daily       ? 03/05/21 1851  ?  amLODipine (NORVASC) 5 MG tablet  Daily       ? 03/05/21 1851  ? ?  ?  ? ?   ? ? ? ?Note:  This document was prepared using Dragon voice recognition software and may include unintentional dictation errors. ? ?  ?Phineas Semen, MD ?03/05/21 1854 ? ?

## 2021-03-05 NOTE — ED Triage Notes (Signed)
Pt via POV from home. Pt c/o L sided CP that radiates to her L arm. Pt also endorses mild SOB. Pt states she also she feels like she has been getting hot flashes. Denies NVD. Denies cardiac hx. Pt has been out of her BP medications for the past 3 weeks ?Pt is A&OX4 and NAD ?

## 2021-03-05 NOTE — Discharge Instructions (Signed)
Please seek medical attention for any high fevers, chest pain, shortness of breath, change in behavior, persistent vomiting, bloody stool or any other new or concerning symptoms.  

## 2021-05-15 ENCOUNTER — Emergency Department: Payer: Self-pay

## 2021-05-15 ENCOUNTER — Encounter: Payer: Self-pay | Admitting: Emergency Medicine

## 2021-05-15 ENCOUNTER — Other Ambulatory Visit: Payer: Self-pay

## 2021-05-15 ENCOUNTER — Emergency Department
Admission: EM | Admit: 2021-05-15 | Discharge: 2021-05-15 | Disposition: A | Discharge status: Home / Self Care | Payer: Self-pay | Attending: Emergency Medicine | Admitting: Emergency Medicine

## 2021-05-15 DIAGNOSIS — I10 Essential (primary) hypertension: Secondary | ICD-10-CM | POA: Insufficient documentation

## 2021-05-15 DIAGNOSIS — J40 Bronchitis, not specified as acute or chronic: Secondary | ICD-10-CM | POA: Insufficient documentation

## 2021-05-15 DIAGNOSIS — J029 Acute pharyngitis, unspecified: Secondary | ICD-10-CM | POA: Insufficient documentation

## 2021-05-15 DIAGNOSIS — Z20822 Contact with and (suspected) exposure to covid-19: Secondary | ICD-10-CM | POA: Insufficient documentation

## 2021-05-15 LAB — CBC WITH DIFFERENTIAL/PLATELET
Abs Immature Granulocytes: 0.04 10*3/uL (ref 0.00–0.07)
Basophils Absolute: 0 10*3/uL (ref 0.0–0.1)
Basophils Relative: 0 %
Eosinophils Absolute: 0.1 10*3/uL (ref 0.0–0.5)
Eosinophils Relative: 1 %
HCT: 40.9 % (ref 36.0–46.0)
Hemoglobin: 15 g/dL (ref 12.0–15.0)
Immature Granulocytes: 0 %
Lymphocytes Relative: 12 %
Lymphs Abs: 1.2 10*3/uL (ref 0.7–4.0)
MCH: 30.2 pg (ref 26.0–34.0)
MCHC: 36.7 g/dL — ABNORMAL HIGH (ref 30.0–36.0)
MCV: 82.3 fL (ref 80.0–100.0)
Monocytes Absolute: 0.6 10*3/uL (ref 0.1–1.0)
Monocytes Relative: 6 %
Neutro Abs: 8.6 10*3/uL — ABNORMAL HIGH (ref 1.7–7.7)
Neutrophils Relative %: 81 %
Platelets: 265 10*3/uL (ref 150–400)
RBC: 4.97 MIL/uL (ref 3.87–5.11)
RDW: 13.3 % (ref 11.5–15.5)
WBC: 10.6 10*3/uL — ABNORMAL HIGH (ref 4.0–10.5)
nRBC: 0 % (ref 0.0–0.2)

## 2021-05-15 LAB — BASIC METABOLIC PANEL
Anion gap: 11 (ref 5–15)
BUN: 10 mg/dL (ref 6–20)
CO2: 24 mmol/L (ref 22–32)
Calcium: 9.6 mg/dL (ref 8.9–10.3)
Chloride: 100 mmol/L (ref 98–111)
Creatinine, Ser: 0.67 mg/dL (ref 0.44–1.00)
GFR, Estimated: >60 mL/min (ref >60)
Glucose, Bld: 95 mg/dL (ref 70–99)
Potassium: 3.6 mmol/L (ref 3.5–5.1)
Sodium: 135 mmol/L (ref 135–145)

## 2021-05-15 LAB — RESP PANEL BY RT-PCR (FLU A&B, COVID) ARPGX2
Influenza A by PCR: NEGATIVE
Influenza B by PCR: NEGATIVE
SARS Coronavirus 2 by RT PCR: NEGATIVE

## 2021-05-15 LAB — GROUP A STREP BY PCR: Group A Strep by PCR: NOT DETECTED

## 2021-05-15 MED ORDER — GUAIFENESIN ER 600 MG PO TB12: 600.0000 mg | ORAL_TABLET | Freq: Two times a day (BID) | ORAL | 0 refills | Status: AC

## 2021-05-15 MED ORDER — AMLODIPINE BESYLATE 5 MG PO TABS
5.0000 mg | ORAL_TABLET | Freq: Once | ORAL | Status: AC
Start: 2021-05-15 — End: 2021-05-15
  Administered 2021-05-15: 5 mg via ORAL
  Filled 2021-05-15: qty 1

## 2021-05-15 MED ORDER — ALBUTEROL SULFATE HFA 108 (90 BASE) MCG/ACT IN AERS: 2.0000 | INHALATION_SPRAY | Freq: Four times a day (QID) | RESPIRATORY_TRACT | 0 refills | Status: DC | PRN

## 2021-05-15 MED ORDER — DEXAMETHASONE 10 MG/ML FOR PEDIATRIC ORAL USE
10.0000 mg | Freq: Once | INTRAMUSCULAR | Status: AC
  Administered 2021-05-15: 10 mg via ORAL
  Filled 2021-05-15: qty 1

## 2021-05-15 MED ORDER — METOPROLOL SUCCINATE ER 50 MG PO TB24
25.0000 mg | ORAL_TABLET | Freq: Every day | ORAL | Status: DC
  Administered 2021-05-15: 25 mg via ORAL
  Filled 2021-05-15: qty 1

## 2021-05-15 MED ORDER — KETOROLAC TROMETHAMINE 30 MG/ML IJ SOLN
15.0000 mg | Freq: Once | INTRAMUSCULAR | Status: AC
  Administered 2021-05-15: 15 mg via INTRAVENOUS
  Filled 2021-05-15: qty 1

## 2021-05-15 MED ORDER — SODIUM CHLORIDE 0.9 % IV BOLUS
1000.0000 mL | Freq: Once | INTRAVENOUS | Status: AC
  Administered 2021-05-15: 1000 mL via INTRAVENOUS

## 2021-05-15 NOTE — ED Notes (Signed)
See triage note  presents with body aches low grade temp.sore throat and cough  states sx's started yesterday ?

## 2021-05-15 NOTE — ED Provider Notes (Signed)
? ?[facility] ?Provider Note ? ? ? Event Date/Time  ? First MD Initiated Contact with Patient 05/15/21 1506   ?  (approximate) ? ? ?History  ? ?Chief Complaint ?Cough and Nasal Congestion ? ? ?HPI ? ?Nancy Ritter is a 46 y.o. female with past medical history of hypertension who presents to the ED complaining of cough and sore throat.  Patient reports that she woke up this morning with sore throat and pain when attempting to swallow.  This has been associated with a dry cough and subjective fever with chills.  She has not taken her temperature and denies any pain in her chest or difficulty breathing.  She has not had any nausea, vomiting, dysuria, abdominal pain, diarrhea, or flank pain.  She is not aware of any sick contacts, has not taken anything for her symptoms at home.  She does admit to missing her usual blood pressure medications this morning, states she has otherwise been compliant with her medication regimen. ?  ? ? ?Physical Exam  ? ?Triage Vital Signs: ?ED Triage Vitals  ?Enc Vitals Group  ?   BP 05/15/21 1414 (!) 197/130  ?   Pulse Rate 05/15/21 1414 (!) 119  ?   Resp 05/15/21 1414 18  ?   Temp 05/15/21 1414 99.1 ?F (37.3 ?C)  ?   Temp Source 05/15/21 1414 Oral  ?   SpO2 05/15/21 1414 95 %  ?   Weight 05/15/21 1415 130 lb (59 kg)  ?   Height 05/15/21 1415 5\' 6"  (1.676 m)  ?   Head Circumference --   ?   Peak Flow --   ?   Pain Score 05/15/21 1414 8  ?   Pain Loc --   ?   Pain Edu? --   ?   Excl. in GC? --   ? ? ?Most recent vital signs: ?Vitals:  ? 05/15/21 1414 05/15/21 1600  ?BP: (!) 197/130 (!) 184/115  ?Pulse: (!) 119 94  ?Resp: 18   ?Temp: 99.1 ?F (37.3 ?C)   ?SpO2: 95% 98%  ? ? ?Constitutional: Alert and oriented. ?Eyes: Conjunctivae are normal. ?Head: Atraumatic. ?Nose: No congestion/rhinnorhea. ?Mouth/Throat: Mucous membranes are moist.  Posterior oropharynx with erythema and edema bilaterally, no exudates noted. ?Cardiovascular: Normal rate, regular rhythm. Grossly normal  heart sounds.  2+ radial pulses bilaterally. ?Respiratory: Normal respiratory effort.  No retractions. Lungs CTAB. ?Gastrointestinal: Soft and nontender. No distention. ?Musculoskeletal: No lower extremity tenderness nor edema.  ?Neurologic:  Normal speech and language. No gross focal neurologic deficits are appreciated. ? ? ? ?ED Results / Procedures / Treatments  ? ?Labs ?(all labs ordered are listed, but only abnormal results are displayed) ?Labs Reviewed  ?CBC WITH DIFFERENTIAL/PLATELET - Abnormal; Notable for the following components:  ?    Result Value  ? WBC 10.6 (*)   ? MCHC 36.7 (*)   ? Neutro Abs 8.6 (*)   ? All other components within normal limits  ?RESP PANEL BY RT-PCR (FLU A&B, COVID) ARPGX2  ?GROUP A STREP BY PCR  ?BASIC METABOLIC PANEL  ? ? ? ?EKG ? ?ED ECG REPORT ?05/17/21, the attending physician, personally viewed and interpreted this ECG. ? ? Date: 05/15/2021 ? EKG Time: 15:48 ? Rate: 119 ? Rhythm: sinus tachycardia ? Axis: Normal ? Intervals:none ? ST&T Change: None ? ?RADIOLOGY ?Chest x-ray reviewed and interpreted by me with no infiltrate, edema, or effusion. ? ?PROCEDURES: ? ?Critical Care performed: No ? ?Procedures ? ? ?  MEDICATIONS ORDERED IN ED: ?Medications  ?metoprolol succinate (TOPROL-XL) 24 hr tablet 25 mg (25 mg Oral Given 05/15/21 1424)  ?amLODipine (NORVASC) tablet 5 mg (5 mg Oral Given 05/15/21 1423)  ?ketorolac (TORADOL) 30 MG/ML injection 15 mg (15 mg Intravenous Given 05/15/21 1557)  ?dexamethasone (DECADRON) 10 MG/ML injection for Pediatric ORAL use 10 mg (10 mg Oral Given 05/15/21 1600)  ?sodium chloride 0.9 % bolus 1,000 mL (1,000 mLs Intravenous New Bag/Given 05/15/21 1556)  ? ? ? ?IMPRESSION / MDM / ASSESSMENT AND PLAN / ED COURSE  ?I reviewed the triage vital signs and the nursing notes. ?             ?               ? ?46 y.o. female with past medical history of hypertension who presents to the ED complaining of cough, sore throat, subjective fevers and chills since  waking up this morning. ? ?Differential diagnosis includes, but is not limited to, strep pharyngitis, viral pharyngitis, viral URI, COVID-19, influenza, pneumonia, hypertensive emergency, medication noncompliance. ? ?Patient well-appearing and in no acute distress, vital signs are remarkable for tachycardia and hypertension and she is maintaining O2 sats on room air.  Testing for COVID-19 and influenza is negative, we will also check testing for strep pharyngitis but no findings to suggest peritonsillar abscess.  Plan to also screen chest x-ray for evidence of pneumonia.  Low suspicion for hypertensive emergency as patient's elevated blood pressure is likely due to medication noncompliance.  While she is tachycardic, other vital signs do not seem concerning for sepsis.  We will screen EKG but no focal neurologic deficits or decreased urinary output to suggest hypertensive emergency. ? ?Labs are reassuring with CBC showing no anemia or leukocytosis, BMP without electrolyte abnormality or AKI.  Strep testing is negative and chest x-ray is also unremarkable, suspect patient's symptoms are secondary to bronchitis.  Blood pressure is gradually improving and she is appropriate for discharge home with outpatient follow-up, will be prescribed albuterol and Mucinex for symptomatic treatment.  She was counseled to return to the ED for new or worsening symptoms and to remain compliant with her antihypertensives.  Patient agrees with plan. ? ?  ? ? ?FINAL CLINICAL IMPRESSION(S) / ED DIAGNOSES  ? ?Final diagnoses:  ?Bronchitis  ?Primary hypertension  ? ? ? ?Rx / DC Orders  ? ?ED Discharge Orders   ? ?      Ordered  ?  albuterol (VENTOLIN HFA) 108 (90 Base) MCG/ACT inhaler  Every 6 hours PRN       ?Note to Pharmacy: Please supply with spacer  ? 05/15/21 1732  ?  guaiFENesin (MUCINEX) 600 MG 12 hr tablet  2 times daily       ? 05/15/21 1732  ? ?  ?  ? ?  ? ? ? ?Note:  This document was prepared using Dragon voice recognition  software and may include unintentional dictation errors. ?  ?Chesley Noon, MD ?05/15/21 1734 ? ?

## 2021-05-15 NOTE — ED Triage Notes (Signed)
Pt to ED via POV for cough and congestion. Pt states that her symptoms started this morning with a sore throat. Pt denies fever.  ?

## 2021-05-15 NOTE — ED Provider Triage Note (Signed)
Emergency Medicine Provider Triage Evaluation Note ? ?Nancy Ritter , a 46 y.o. female  was evaluated in triage.  Pt complains of URI symptoms.  Patient does not take her blood pressure medicine today. ? ?Review of Systems  ?Positive: URI symptoms ?Negative: Chest pain, shortness of breath ? ?Physical Exam  ?BP (!) 197/130 (BP Location: Left Arm) Comment: Hx/o HTN, has not taken medication today  Pulse (!) 119   Temp 99.1 ?F (37.3 ?C) (Oral)   Resp 18   Ht 5\' 6"  (1.676 m)   Wt 59 kg   SpO2 95%   BMI 20.98 kg/m?  ?Gen:   Awake, no distress   ?Resp:  Normal effort  ?MSK:   Moves extremities without difficulty  ?Other:   ? ?Medical Decision Making  ?Medically screening exam initiated at 2:21 PM.  Appropriate orders placed.  was informed that the remainder of the evaluation will be completed by another provider, this initial triage assessment does not replace that evaluation, and the importance of remaining in the ED until their evaluation is complete. ? ?Covid swab obtained, patient was given her daily blood pressure medication ?  ?Nancy Plane, PA-C ?05/15/21 1422 ? ?

## 2022-05-07 ENCOUNTER — Emergency Department: Payer: Self-pay

## 2022-05-07 ENCOUNTER — Encounter: Payer: Self-pay | Admitting: Intensive Care

## 2022-05-07 ENCOUNTER — Emergency Department
Admission: EM | Admit: 2022-05-07 | Discharge: 2022-05-07 | Disposition: A | Discharge status: Home / Self Care | Payer: Self-pay | Attending: Emergency Medicine | Admitting: Emergency Medicine

## 2022-05-07 ENCOUNTER — Other Ambulatory Visit: Payer: Self-pay

## 2022-05-07 DIAGNOSIS — I10 Essential (primary) hypertension: Secondary | ICD-10-CM | POA: Insufficient documentation

## 2022-05-07 DIAGNOSIS — R079 Chest pain, unspecified: Secondary | ICD-10-CM | POA: Insufficient documentation

## 2022-05-07 LAB — CBC
HCT: 43.2 % (ref 36.0–46.0)
Hemoglobin: 15.7 g/dL — ABNORMAL HIGH (ref 12.0–15.0)
MCH: 30.2 pg (ref 26.0–34.0)
MCHC: 36.3 g/dL — ABNORMAL HIGH (ref 30.0–36.0)
MCV: 83.1 fL (ref 80.0–100.0)
Platelets: 293 10*3/uL (ref 150–400)
RBC: 5.2 MIL/uL — ABNORMAL HIGH (ref 3.87–5.11)
RDW: 13.2 % (ref 11.5–15.5)
WBC: 7.4 10*3/uL (ref 4.0–10.5)
nRBC: 0 % (ref 0.0–0.2)

## 2022-05-07 LAB — HEPATIC FUNCTION PANEL
ALT: 24 U/L (ref 0–44)
AST: 35 U/L (ref 15–41)
Albumin: 4.3 g/dL (ref 3.5–5.0)
Alkaline Phosphatase: 63 U/L (ref 38–126)
Bilirubin, Direct: <0.1 mg/dL (ref 0.0–0.2)
Total Bilirubin: 0.7 mg/dL (ref 0.3–1.2)
Total Protein: 8 g/dL (ref 6.5–8.1)

## 2022-05-07 LAB — BASIC METABOLIC PANEL
Anion gap: 11 (ref 5–15)
BUN: 13 mg/dL (ref 6–20)
CO2: 25 mmol/L (ref 22–32)
Calcium: 9 mg/dL (ref 8.9–10.3)
Chloride: 100 mmol/L (ref 98–111)
Creatinine, Ser: 0.93 mg/dL (ref 0.44–1.00)
GFR, Estimated: >60 mL/min (ref >60)
Glucose, Bld: 109 mg/dL — ABNORMAL HIGH (ref 70–99)
Potassium: 3.6 mmol/L (ref 3.5–5.1)
Sodium: 136 mmol/L (ref 135–145)

## 2022-05-07 LAB — LIPASE, BLOOD: Lipase: 32 U/L (ref 11–51)

## 2022-05-07 LAB — TROPONIN I (HIGH SENSITIVITY)
Troponin I (High Sensitivity): 8 ng/L (ref <18)
Troponin I (High Sensitivity): 12 ng/L (ref <18)

## 2022-05-07 MED ORDER — LABETALOL HCL 5 MG/ML IV SOLN
10.0000 mg | Freq: Once | INTRAVENOUS | Status: AC
  Administered 2022-05-07: 10 mg via INTRAVENOUS
  Filled 2022-05-07: qty 4

## 2022-05-07 MED ORDER — AMLODIPINE BESYLATE 5 MG PO TABS: 10.0000 mg | ORAL_TABLET | Freq: Every day | ORAL | 0 refills | Status: DC

## 2022-05-07 MED ORDER — IRBESARTAN 150 MG PO TABS
150.0000 mg | ORAL_TABLET | Freq: Once | ORAL | Status: DC
  Filled 2022-05-07: qty 1

## 2022-05-07 MED ORDER — CLONIDINE HCL 0.1 MG PO TABS
0.1000 mg | ORAL_TABLET | Freq: Once | ORAL | Status: AC
  Administered 2022-05-07: 0.1 mg via ORAL
  Filled 2022-05-07: qty 1

## 2022-05-07 MED ORDER — AMLODIPINE BESYLATE 5 MG PO TABS
5.0000 mg | ORAL_TABLET | Freq: Once | ORAL | Status: AC
  Administered 2022-05-07: 5 mg via ORAL
  Filled 2022-05-07: qty 1

## 2022-05-07 MED ORDER — IOHEXOL 350 MG/ML SOLN
100.0000 mL | Freq: Once | INTRAVENOUS | Status: AC | PRN
  Administered 2022-05-07: 100 mL via INTRAVENOUS

## 2022-05-07 MED ORDER — CLONIDINE HCL 0.1 MG PO TABS: 0.1000 mg | ORAL_TABLET | Freq: Once | ORAL | Status: DC

## 2022-05-07 MED ORDER — IRBESARTAN 150 MG PO TABS: 150.0000 mg | ORAL_TABLET | Freq: Every day | ORAL | 0 refills | Status: DC

## 2022-05-07 MED ORDER — METOPROLOL TARTRATE 25 MG PO TABS
25.0000 mg | ORAL_TABLET | Freq: Once | ORAL | Status: AC
  Administered 2022-05-07: 25 mg via ORAL
  Filled 2022-05-07: qty 1

## 2022-05-07 NOTE — ED Triage Notes (Signed)
C/o intermittent chest pressure and left arm numbness for a weeks.

## 2022-05-07 NOTE — Discharge Instructions (Signed)
For your blood pressure:  Take AMLODIPINE 10 mg daily. -- You were prescribed 5 mg tablets previously -- You can take 2 5 mg tablets for a total of 10 mg daily -- I've prescribed additional 5 mg tablets if you need them  Take Metoprolol 25 mg daily (already prescribed)  START Irbesartan 150 mg daily (can take morning or evening)  Follow-up with primary doctor in 1 week  Try to keep track of your blood pressures

## 2022-05-07 NOTE — ED Provider Notes (Signed)
[facility] Provider Note    Event Date/Time   First MD Initiated Contact with Patient 05/07/22 1927     (approximate)   History   Chest Pain   HPI  Nancy Ritter is a 47 y.o. female  here with chest pain. Pt reports that over the past week, she has had intermittent, transient chest pain which she describes as a pressure-like sensation. It has occasionally radiated toward her left shoulder. She does not recall if its associated with anything in particular other than some mild association possibly with smoking. No association with eating. No major change with exertion.       Physical Exam   Triage Vital Signs: ED Triage Vitals  Enc Vitals Group     BP 05/07/22 1756 (!) 211/126     Pulse Rate 05/07/22 1756 85     Resp 05/07/22 1756 18     Temp 05/07/22 1756 98.7 F (37.1 C)     Temp Source 05/07/22 1756 Oral     SpO2 05/07/22 1756 98 %     Weight 05/07/22 1758 145 lb (65.8 kg)     Height 05/07/22 1758 5\' 6"  (1.676 m)     Head Circumference --      Peak Flow --      Pain Score 05/07/22 1757 8     Pain Loc --      Pain Edu? --      Excl. in GC? --     Most recent vital signs: Vitals:   05/07/22 2241 05/07/22 2300  BP: (!) 139/101 (!) 146/100  Pulse: 69 71  Resp: 12 17  Temp:  98 F (36.7 C)  SpO2:  100%     General: Awake, no distress.  CV:  Good peripheral perfusion.  Resp:  Normal work of breathing. Lungs clear. Abd:  No distention. No tenderness.No epigastric or RUQ TTP. Other:  Pulses 2+ and symmetric b/l UE and LE.    ED Results / Procedures / Treatments   Labs (all labs ordered are listed, but only abnormal results are displayed) Labs Reviewed  BASIC METABOLIC PANEL - Abnormal; Notable for the following components:      Result Value   Glucose, Bld 109 (*)    All other components within normal limits  CBC - Abnormal; Notable for the following components:   RBC 5.20 (*)    Hemoglobin 15.7 (*)    MCHC 36.3 (*)    All  other components within normal limits  HEPATIC FUNCTION PANEL  LIPASE, BLOOD  POC URINE PREG, ED  TROPONIN I (HIGH SENSITIVITY)  TROPONIN I (HIGH SENSITIVITY)     EKG Normal sinus rhythm, VR 78. PR 158, QRS 82, QTc 421. No acute ST elevations or depressions.    RADIOLOGY CT Angio: Negative CXR: Clear   I also independently reviewed and agree with radiologist interpretations.   PROCEDURES:  Critical Care performed: No  .1-3 Lead EKG Interpretation  Performed by: Shaune Pollack, MD Authorized by: Shaune Pollack, MD     Interpretation: normal     ECG rate:  70-90   ECG rate assessment: normal     Rhythm: sinus rhythm     Ectopy: none     Conduction: normal   Comments:     Indication: Chest pain    MEDICATIONS ORDERED IN ED: Medications  cloNIDine (CATAPRES) tablet 0.1 mg (0.1 mg Oral Given 05/07/22 2034)  metoprolol tartrate (LOPRESSOR) tablet 25 mg (25 mg Oral Given 05/07/22  2034)  iohexol (OMNIPAQUE) 350 MG/ML injection 100 mL (100 mLs Intravenous Contrast Given 05/07/22 2141)  labetalol (NORMODYNE) injection 10 mg (10 mg Intravenous Given 05/07/22 2216)  amLODipine (NORVASC) tablet 5 mg (5 mg Oral Given 05/07/22 2216)     IMPRESSION / MDM / ASSESSMENT AND PLAN / ED COURSE  I reviewed the triage vital signs and the nursing notes.                              Differential diagnosis includes, but is not limited to, hypertensive urgency, ACS, atypical MSK pain, GERD/gatritis, pancreatitis  Patient's presentation is most consistent with acute presentation with potential threat to life or bodily function.  The patient is on the cardiac monitor to evaluate for evidence of arrhythmia and/or significant heart rate changes  47 yo F here with atypical chest pain. Pt severely hypertensive on arrival and apparently restarted her meds about a week ago. Given the degree of her HTN with some radiation to the arm, CT Angi obtained, reviewed, and is negative. Trop neg x 2. EKG  nonischemic. Doubt acs. LFTs and lipase normal. No other apparent emergent pathology.  Will increase her BP regimen to norvasc 10 mg and add avapro. Will refer to PCP for closer f/u for severe-range HTN which has been ongoing but now may be symptomatic. Return precautions given.    FINAL CLINICAL IMPRESSION(S) / ED DIAGNOSES   Final diagnoses:  Nonspecific chest pain  Essential hypertension     Rx / DC Orders   ED Discharge Orders          Ordered    irbesartan (AVAPRO) 150 MG tablet  Daily        05/07/22 2255    amLODipine (NORVASC) 5 MG tablet  Daily        05/07/22 2255             Note:  This document was prepared using Dragon voice recognition software and may include unintentional dictation errors.   Shaune Pollack, MD 05/07/22 [phone]
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
## 2023-03-07 ENCOUNTER — Ambulatory Visit
Admission: RE | Admit: 2023-03-07 | Discharge: 2023-03-07 | Disposition: A | Discharge status: Home / Self Care | Attending: Family Medicine | Admitting: Family Medicine

## 2023-03-07 ENCOUNTER — Ambulatory Visit (INDEPENDENT_AMBULATORY_CARE_PROVIDER_SITE_OTHER): Payer: Self-pay | Admitting: Family Medicine

## 2023-03-07 ENCOUNTER — Ambulatory Visit
Admission: RE | Admit: 2023-03-07 | Discharge: 2023-03-07 | Disposition: A | Discharge status: Home / Self Care | Source: Ambulatory Visit | Attending: Family Medicine | Admitting: Family Medicine

## 2023-03-07 ENCOUNTER — Encounter: Payer: Self-pay | Admitting: Family Medicine

## 2023-03-07 VITALS — BP 122/76 | HR 82 | Resp 16 | Ht 66.0 in | Wt 143.0 lb

## 2023-03-07 DIAGNOSIS — Z1211 Encounter for screening for malignant neoplasm of colon: Secondary | ICD-10-CM

## 2023-03-07 DIAGNOSIS — F17200 Nicotine dependence, unspecified, uncomplicated: Secondary | ICD-10-CM | POA: Insufficient documentation

## 2023-03-07 DIAGNOSIS — R079 Chest pain, unspecified: Secondary | ICD-10-CM

## 2023-03-07 DIAGNOSIS — N951 Menopausal and female climacteric states: Secondary | ICD-10-CM

## 2023-03-07 DIAGNOSIS — Z0000 Encounter for general adult medical examination without abnormal findings: Secondary | ICD-10-CM | POA: Diagnosis not present

## 2023-03-07 DIAGNOSIS — J329 Chronic sinusitis, unspecified: Secondary | ICD-10-CM

## 2023-03-07 DIAGNOSIS — F1021 Alcohol dependence, in remission: Secondary | ICD-10-CM

## 2023-03-07 DIAGNOSIS — R052 Subacute cough: Secondary | ICD-10-CM | POA: Diagnosis present

## 2023-03-07 DIAGNOSIS — Z1159 Encounter for screening for other viral diseases: Secondary | ICD-10-CM

## 2023-03-07 DIAGNOSIS — F32A Depression, unspecified: Secondary | ICD-10-CM

## 2023-03-07 DIAGNOSIS — F419 Anxiety disorder, unspecified: Secondary | ICD-10-CM

## 2023-03-07 DIAGNOSIS — I10 Essential (primary) hypertension: Secondary | ICD-10-CM

## 2023-03-07 MED ORDER — FLUTICASONE PROPIONATE 50 MCG/ACT NA SUSP
2.0000 | Freq: Every day | NASAL | 6 refills | Status: AC
Start: 2023-03-07 — End: ?

## 2023-03-07 MED ORDER — LEVOCETIRIZINE DIHYDROCHLORIDE 5 MG PO TABS
5.0000 mg | ORAL_TABLET | Freq: Every evening | ORAL | 5 refills | Status: AC
Start: 2023-03-07 — End: ?

## 2023-03-07 NOTE — Assessment & Plan Note (Signed)
 Previously needed medication to manage, but today off of medications BP is near goal Per chart review she has been on amlodipine, metoprolol and cardura before Will continue without meds now Encouraged smoking cessation BP Readings from Last 3 Encounters:  03/07/23 122/76  05/07/22 (!) 146/100  05/15/21 (!) 144/88

## 2023-03-07 NOTE — Assessment & Plan Note (Signed)
 Prior heavy drinker/liquor She has done rehab several times, the last was 4 years ago, she gradually cut back amount of ETOH and about 3 weeks ago completely stopped

## 2023-03-07 NOTE — Progress Notes (Signed)
 Name: Nancy Ritter   MRN: [id number]    DOB: 05/17/1975   Date:03/07/2023       Progress Note  Chief Complaint  Patient presents with   Establish Care     Subjective:   Nancy Ritter is a 48 y.o. female, presents to clinic to establish care.  Last PCP at [facility] several years ago  Perimenopausal/womans health Prior depoprovera birth control - no longer seeing Dr. Dalbert Garnet or GYN last time taking depo was maybe 2023, was only on it 2022-2023 Irregular cycles for a while, last menstrual cycle was 4-5 month ago No concerns, would like to still be est with gyn  G4P3 Sexually active with current partner/boyfriend No pelvic concerns Due for cervical cancer screening   Hx of HTN, not currently on medications, she was previously on doxazosin Better since she stopped ETOH BP Readings from Last 3 Encounters:  03/07/23 122/76  05/07/22 (!) 146/100  05/15/21 (!) 144/88   CP intermittent sometimes happening when she is smoking, sometimes just occurs at rest, pain and pressure located to central chest w/o radiation, lasts seconds to less than minute, sometimes she feels hot when the sx occur No associated SOB, palpitations diaphoresis or near syncope No exertional CP Multiple past ED visits for CP and prior consult with cardiology at [facility] in 2022 reviewed past ED visits, ECGs, cardiology OV notes   Previously on the following meds, but she has d/c them on her own aspirin 81 MG EC tablet - Discarded on 03/07/2023 by Dollene Primrose, CMA from [facility]  doxazosin mesylate (CARDURA) 1 mg tablet - Discarded on 03/07/2023 by Dollene Primrose, CMA from [facility]  medroxyPROGESTERone (DEPO-PROVERA) 150 mg/mL injection - Discarded on 03/07/2023 by Dollene Primrose, CMA from [facility]  sertraline (ZOLOFT) 50 mg tablet - Discarded on 03/07/2023 by Dollene Primrose, CMA from [facility] zoloft before - made her too tired Hx of Anxiety and  depression in the past, she is currently feeling good not on meds    03/07/2023   10:21 AM  Depression screen PHQ 2/9  Decreased Interest 0  Down, Depressed, Hopeless 0  PHQ - 2 Score 0  Altered sleeping 1  Tired, decreased energy 0  Change in appetite 0  Feeling bad or failure about yourself  0  Trouble concentrating 0  Moving slowly or fidgety/restless 0  Suicidal thoughts 0  PHQ-9 Score 1      03/07/2023   10:21 AM  GAD 7 : Generalized Anxiety Score  Nervous, Anxious, on Edge 0  Control/stop worrying 1  Worry too much - different things 0  Trouble relaxing 0  Restless 0  Easily annoyed or irritable 0  Afraid - awful might happen 0  Total GAD 7 Score 1    Recent URI/bronchitis productive cough, she had bloody discharge from nose and coughing up, bloody discharge has improved but cough has lingered at least 4-5 weeks  No SOB, no wheeze No past need for inhalers or pulm work up Smoking currently -  1 ppd Since 2019 And smoking marijuana as well  ETOH dependence/alcoholism she stopped drinking 3 weeks ago after cutting back a lot for years Last rehab was 2021, hx of multiple rehab stays She was having shakes if she didn't drink a little up until she stopped a few weeks ago Previously when she would drink she heavily drank liquor    Current Outpatient Medications:    fluticasone (FLONASE)  50 MCG/ACT nasal spray, Place 2 sprays into both nostrils daily., Disp: 16 g, Rfl: 6   levocetirizine (XYZAL) 5 MG tablet, Take 1 tablet (5 mg total) by mouth every evening. As needed for nasal/sinus symptoms or post nasal drip, Disp: 30 tablet, Rfl: 5  Patient Active Problem List   Diagnosis Date Noted   Current smoker 03/07/2023   Chest pain 11/25/2020   Hypertensive urgency 11/25/2020   Essential hypertension 11/25/2020   Elevated troponin 11/25/2020    History reviewed. No pertinent surgical history.  Family History  Problem Relation Age of Onset   Stroke Mother     Diabetes Mother    Glaucoma Mother    Hypertension Father    Diabetes Sister    Varicose Veins Sister    Hypertension Brother    Diabetes Brother     Social History   Tobacco Use   Smoking status: Every Day    Current packs/day: 1.00    Average packs/day: 1 pack/day for 6.2 years (6.2 ttl pk-yrs)    Types: Cigarettes    Start date: 01/01/2017   Smokeless tobacco: Never  Vaping Use   Vaping status: Never Used  Substance Use Topics   Alcohol use: Not Currently    Comment: prior heavy drinker - liquor   Drug use: Yes    Types: Marijuana     No Known Allergies  Health Maintenance  Topic Date Due   Hepatitis C Screening  Never done   Cervical Cancer Screening (HPV/Pap Cotest)  04/07/2018   Colonoscopy  Never done   COVID-19 Vaccine (1 - 2024-25 season) 03/23/2023 (Originally 09/02/2022)   INFLUENZA VACCINE  04/01/2023 (Originally 08/02/2022)   DTaP/Tdap/Td (2 - Tdap) 03/06/2024 (Originally 11/03/2016)   Pneumococcal Vaccine 18-80 Years old (1 of 2 - PCV) 03/06/2024 (Originally 06/06/1981)   HIV Screening  Completed   HPV VACCINES  Aged Out    Chart Review Today: I personally reviewed active problem list, medication list, allergies, family history, social history, health maintenance, notes from last encounter, lab results, imaging with the patient/caregiver today.   Review of Systems  Constitutional: Negative.  Negative for activity change, appetite change, chills, diaphoresis, fatigue and fever.  HENT:  Positive for congestion.   Eyes: Negative.   Respiratory:  Positive for cough. Negative for shortness of breath and wheezing.   Cardiovascular:  Positive for chest pain. Negative for palpitations and leg swelling.  Gastrointestinal: Negative.   Endocrine: Negative.   Genitourinary: Negative.   Musculoskeletal: Negative.   Skin: Negative.   Allergic/Immunologic: Negative.   Neurological: Negative.   Hematological: Negative.   Psychiatric/Behavioral: Negative.    All other  systems reviewed and are negative.    Objective:   Vitals:   03/07/23 1021  BP: 122/76  Pulse: 82  Resp: 16  SpO2: 98%  Weight: 143 lb (64.9 kg)  Height: 5\' 6"  (1.676 m)    Body mass index is 23.08 kg/m.  Physical Exam Vitals and nursing note reviewed.  Constitutional:      General: She is not in acute distress.    Appearance: Normal appearance. She is well-developed and normal weight. She is not ill-appearing, toxic-appearing or diaphoretic.  HENT:     Head: Normocephalic and atraumatic.     Right Ear: External ear normal.     Left Ear: External ear normal.     Nose: Congestion and rhinorrhea present.     Mouth/Throat:     Mouth: Mucous membranes are moist.     Pharynx:  Oropharynx is clear. No oropharyngeal exudate or posterior oropharyngeal erythema.  Eyes:     General: No scleral icterus.       Right eye: No discharge.        Left eye: No discharge.     Conjunctiva/sclera: Conjunctivae normal.  Neck:     Trachea: No tracheal deviation.  Cardiovascular:     Rate and Rhythm: Normal rate and regular rhythm.     Pulses: Normal pulses.     Heart sounds: Normal heart sounds. No murmur heard.    No friction rub. No gallop.  Pulmonary:     Effort: Pulmonary effort is normal. No respiratory distress.     Breath sounds: Normal breath sounds. No stridor. No wheezing, rhonchi or rales.  Abdominal:     General: Bowel sounds are normal. There is no distension.     Palpations: Abdomen is soft.  Skin:    General: Skin is warm and dry.     Capillary Refill: Capillary refill takes less than 2 seconds.     Findings: No rash.  Neurological:     Mental Status: She is alert.     Motor: No abnormal muscle tone.     Coordination: Coordination normal.     Gait: Gait normal.  Psychiatric:        Mood and Affect: Mood normal.        Behavior: Behavior normal.     ECG interpretation   Date: 03/07/23  Rate: 56  Rhythm: sinus brady  Intervals: normal  ST/T Wave  abnormalities: normal  Conduction Disutrbances: none  Narrative Interpretation: sinus bradycardia possible left atrial enlargement  Old EKG Reviewed: 05/07/2022 N[facility]     Functional Status Survey: Is the patient deaf or have difficulty hearing?: No Does the patient have difficulty seeing, even when wearing glasses/contacts?: No Does the patient have difficulty concentrating, remembering, or making decisions?: No Does the patient have difficulty walking or climbing stairs?: No Does the patient have difficulty dressing or bathing?: No Does the patient have difficulty doing errands alone such as visiting a doctor's office or shopping?: No  Results for orders placed or performed during the hospital encounter of 05/07/22  Basic metabolic panel   Collection Time: 05/07/22  6:01 PM  Result Value Ref Range   Sodium 136 135 - 145 mmol/L   Potassium 3.6 3.5 - 5.1 mmol/L   Chloride 100 98 - 111 mmol/L   CO2 25 22 - 32 mmol/L   Glucose, Bld 109 (H) 70 - 99 mg/dL   BUN 13 6 - 20 mg/dL   Creatinine, Ser 8.11 0.44 - 1.00 mg/dL   Calcium 9.0 8.9 - 91.4 mg/dL   GFR, Estimated >78 >29 mL/min   Anion gap 11 5 - 15  CBC   Collection Time: 05/07/22  6:01 PM  Result Value Ref Range   WBC 7.4 4.0 - 10.5 K/uL   RBC 5.20 (H) 3.87 - 5.11 MIL/uL   Hemoglobin 15.7 (H) 12.0 - 15.0 g/dL   HCT 56.2 13.0 - 86.5 %   MCV 83.1 80.0 - 100.0 fL   MCH 30.2 26.0 - 34.0 pg   MCHC 36.3 (H) 30.0 - 36.0 g/dL   RDW 78.4 69.6 - 29.5 %   Platelets 293 150 - 400 K/uL   nRBC 0.0 0.0 - 0.2 %  Troponin I (High Sensitivity)   Collection Time: 05/07/22  6:01 PM  Result Value Ref Range   Troponin I (High Sensitivity) 8 <18 ng/L  Hepatic function  panel   Collection Time: 05/07/22  7:37 PM  Result Value Ref Range   Total Protein 8.0 6.5 - 8.1 g/dL   Albumin 4.3 3.5 - 5.0 g/dL   AST 35 15 - 41 U/L   ALT 24 0 - 44 U/L   Alkaline Phosphatase 63 38 - 126 U/L   Total Bilirubin 0.7 0.3 - 1.2 mg/dL   Bilirubin, Direct <4.6  0.0 - 0.2 mg/dL   Indirect Bilirubin NOT CALCULATED 0.3 - 0.9 mg/dL  Lipase, blood   Collection Time: 05/07/22  7:37 PM  Result Value Ref Range   Lipase 32 11 - 51 U/L  Troponin I (High Sensitivity)   Collection Time: 05/07/22  7:37 PM  Result Value Ref Range   Troponin I (High Sensitivity) 12 <18 ng/L      Assessment & Plan:   Problem List Items Addressed This Visit     Chest pain   Intermittent, brief, occurs randomly and at rest, central chest described as pressure, resolves in less than a minute, no associated sx with CP, no exertional component ECG today sinus brady Multiple prior ED visits for CP and cardiology f/up once in 2022 She would like to est with cardiology Risk factors - hx of hypertension, alcoholism, current smoker, family hx of HTN and stroke Labs ECG and CXR today      Relevant Orders   CBC with Differential/Platelet   COMPLETE METABOLIC PANEL WITH GFR   Lipid Profile   DG Chest 2 View   EKG 12-Lead   Ambulatory referral to Cardiology   Essential hypertension   Previously needed medication to manage, but today off of medications BP is near goal Per chart review she has been on amlodipine, metoprolol and cardura before Will continue without meds now Encouraged smoking cessation BP Readings from Last 3 Encounters:  03/07/23 122/76  05/07/22 (!) 146/100  05/15/21 (!) 144/88        Relevant Orders   COMPLETE METABOLIC PANEL WITH GFR   Current smoker   1 ppd for at least 6 years, smoked heavier in the past, hopes to cut back and eventually quit Discussed affects of smoking and smoking cessation as noted below for 3+ min  Smoking cessation instruction/counseling given:  counseled patient on the dangers of tobacco use, advised patient to stop smoking, and reviewed strategies to maximize success       Relevant Orders   CBC with Differential/Platelet   COMPLETE METABOLIC PANEL WITH GFR   DG Chest 2 View   Perimenopausal   Infrequent menses for  years, last cycle 5-6 months ago Explained to pt menopause occurs when 12 months without cycle and then after that she will be postmenopausal No specific sx or complaints at this time, possibly cardiac sx may be perimenopausal, chest pressure sometimes occurs with feeling hot ? Hot flashes?  Pt reported possibly only being on depo from 2022-2023 but care everywhere gyn chart shows depo rx as far back at 2019      Anxiety and depression   Has tried meds in the past Did not do well with zoloft - made her too sleepy/lethargic Doing well currently without any meds Mood is good Phq 9 and gad 7 reviewed today and neg      Alcoholism in remission [facility])   Prior heavy drinker/liquor She has done rehab several times, the last was 4 years ago, she gradually cut back amount of ETOH and about 3 weeks ago completely stopped  Inpt rehab/psych admission in  care everywhere at [facility] 04/2019, admitted with following dx: MDD (major depressive disorder), severe (*) (Primary Dx);  Severe alcohol use disorder (*);  Severe episode of recurrent major depressive disorder, without psychotic features (*);  Tobacco use disorder       Relevant Orders   COMPLETE METABOLIC PANEL WITH GFR   Other Visit Diagnoses       Encounter for medical examination to establish care    -  Primary   extensive chart review of past ED visits and through care everywhere going back 5+ years      Need for hepatitis C screening test       Relevant Orders   Hepatitis C Antibody     Colon cancer screening       Relevant Orders   Ambulatory referral to Gastroenterology     Subacute cough       x4-5 weeks, lungs CTA A&P, can try to tx rhinitis/post nasal drip and use mucinex and OTC cough meds, CXR ordered, overall sx gradually improving   Relevant Orders   CBC with Differential/Platelet   DG Chest 2 View     Rhinosinusitis       on exam very swollen and erythematous nasal mucosa and turbinates   Relevant Medications    fluticasone (FLONASE) 50 MCG/ACT nasal spray   levocetirizine (XYZAL) 5 MG tablet          Return for CPE in 3-6 months.   Sooner if pt prefers to do HM and close gaps  Danelle Berry, PA-C 03/07/23 10:37 AM

## 2023-03-07 NOTE — Assessment & Plan Note (Signed)
 Infrequent menses for years, last cycle 5-6 months ago Explained to pt menopause occurs when 12 months without cycle and then after that she will be postmenopausal No specific sx or complaints at this time, possibly cardiac sx may be perimenopausal, chest pressure sometimes occurs with feeling hot ? Hot flashes?

## 2023-03-07 NOTE — Assessment & Plan Note (Signed)
 Has tried meds in the past Did not do well with zoloft - made her too sleepy/lethargic Doing well currently without any meds Mood is good Phq 9 and gad 7 reviewed today and neg

## 2023-03-07 NOTE — Assessment & Plan Note (Signed)
 Intermittent, brief, occurs randomly and at rest, central chest described as pressure, resolves in less than a minute, no associated sx with CP, no exertional component ECG today sinus brady Multiple prior ED visits for CP and cardiology f/up once in 2022 She would like to est with cardiology Risk factors - hx of hypertension, alcoholism, current smoker, family hx of HTN and stroke Labs ECG and CXR today

## 2023-03-07 NOTE — Assessment & Plan Note (Signed)
 1 ppd for at least 6 years, smoked heavier in the past, hopes to cut back and eventually quit  Smoking cessation instruction/counseling given:  counseled patient on the dangers of tobacco use, advised patient to stop smoking, and reviewed strategies to maximize success

## 2023-03-07 NOTE — Patient Instructions (Signed)
 I will refer you to cardiology for a consult.  Please get your chest x-ray across the street at outpatient imaging  For nasal symptoms and cough you can try the medications I sent in, I also recommend saline nasal spray, mucinex and if you have symptoms get any worse please come back in for an appointment so I can reevaluate you and give you other treatment options as needed.

## 2023-03-08 ENCOUNTER — Encounter: Payer: Self-pay | Admitting: Family Medicine

## 2023-03-08 LAB — CBC WITH DIFFERENTIAL/PLATELET
Absolute Lymphocytes: 2037 {cells}/uL (ref 850–3900)
Absolute Monocytes: 422 {cells}/uL (ref 200–950)
Basophils Absolute: 20 {cells}/uL (ref 0–200)
Basophils Relative: 0.3 %
Eosinophils Absolute: 268 {cells}/uL (ref 15–500)
Eosinophils Relative: 4 %
HCT: 43.6 % (ref 35.0–45.0)
Hemoglobin: 14.7 g/dL (ref 11.7–15.5)
MCH: 30.1 pg (ref 27.0–33.0)
MCHC: 33.7 g/dL (ref 32.0–36.0)
MCV: 89.2 fL (ref 80.0–100.0)
MPV: 10.1 fL (ref 7.5–12.5)
Monocytes Relative: 6.3 %
Neutro Abs: 3953 {cells}/uL (ref 1500–7800)
Neutrophils Relative %: 59 %
Platelets: 343 10*3/uL (ref 140–400)
RBC: 4.89 10*6/uL (ref 3.80–5.10)
RDW: 13.8 % (ref 11.0–15.0)
Total Lymphocyte: 30.4 %
WBC: 6.7 10*3/uL (ref 3.8–10.8)

## 2023-03-08 LAB — COMPLETE METABOLIC PANEL WITH GFR
AG Ratio: 1.4 (calc) (ref 1.0–2.5)
ALT: 9 U/L (ref 6–29)
AST: 10 U/L (ref 10–35)
Albumin: 4 g/dL (ref 3.6–5.1)
Alkaline phosphatase (APISO): 54 U/L (ref 31–125)
BUN: 8 mg/dL (ref 7–25)
CO2: 29 mmol/L (ref 20–32)
Calcium: 9.7 mg/dL (ref 8.6–10.2)
Chloride: 103 mmol/L (ref 98–110)
Creat: 0.74 mg/dL (ref 0.50–0.99)
Globulin: 2.9 g/dL (ref 1.9–3.7)
Glucose, Bld: 80 mg/dL (ref 65–99)
Potassium: 4.5 mmol/L (ref 3.5–5.3)
Sodium: 138 mmol/L (ref 135–146)
Total Bilirubin: 0.4 mg/dL (ref 0.2–1.2)
Total Protein: 6.9 g/dL (ref 6.1–8.1)
eGFR: 100 mL/min/{1.73_m2} (ref >=60)

## 2023-03-08 LAB — LIPID PANEL
Cholesterol: 183 mg/dL (ref <200)
HDL: 39 mg/dL — ABNORMAL LOW (ref >=50)
LDL Cholesterol (Calc): 128 mg/dL — ABNORMAL HIGH
Non-HDL Cholesterol (Calc): 144 mg/dL — ABNORMAL HIGH (ref <130)
Total CHOL/HDL Ratio: 4.7 (calc) (ref <5.0)
Triglycerides: 72 mg/dL (ref <150)

## 2023-03-08 LAB — HEPATITIS C ANTIBODY: Hepatitis C Ab: NONREACTIVE

## 2023-03-12 ENCOUNTER — Telehealth: Payer: Self-pay

## 2023-03-12 DIAGNOSIS — Z1211 Encounter for screening for malignant neoplasm of colon: Secondary | ICD-10-CM

## 2023-03-12 NOTE — Telephone Encounter (Signed)
 Patient not ready to schedule.  Informed her I would send her a reminder letter to call office.  Thanks,  [place], [place]

## 2023-03-12 NOTE — Progress Notes (Unsigned)
  Cardiology Office Note:  .   Date:  03/14/2023  ID:  Sheliah Ritter, DOB [birth year], MRN [id number] PCP: Danelle Berry, PA-C  [facility] Providers Cardiologist:  None {    History of Present Illness: .   Nancy Ritter is a 48 y.o. female has a h/o HTN, tobacco use, alcohol abuse who presents as a new patient for chest pain.   In chart review it appears she has had admissions and ER visits for chest pain in the setting of elevated BP. She was admitted in 11/2020 for chest pain, hypertensive urgency and elevated troponin (19). She was seen by [facility], who recommended outpatient follow-up.   She smokes 1 ppd. She used to be an alcoholic (quit 1 month ago). No drug history. Mother had stroke and heart attack, diabetes.   Today, the patient reports chest pain that occurred a few weeks ago. It would occur mostly when she was drinking, but is now occurring after quitting drinking. It feels like a pressure in the center of the chest pain. It's worse when she takes a deep breath. Also feels a little tingle in her left arm. Pain is worse with movement as well. She thinks it may be from smoking. She reports chest pain this morning. Sometimes gets nausea. No SOB. No swelling on her feet, no orthopnea or pnd. No recent fever, chills. No active chest pain today.    Studies Reviewed: Marland Kitchen   EKG Interpretation Date/Time:  Thursday March 14 2023 08:50:38 EDT Ventricular Rate:  65 PR Interval:  164 QRS Duration:  80 QT Interval:  404 QTC Calculation: 420 R Axis:   11  Text Interpretation: Normal sinus rhythm Normal ECG When compared with ECG of 07-May-2022 18:00, No significant change was found Confirmed by Fransico Michael, Brelee Renk ([id number]) on 03/14/2023 9:11:56 AM    Physical Exam:   VS:  BP (!) 148/98   Pulse 92   Ht 5\' 6"  (1.676 m)   Wt 142 lb 12.8 oz (64.8 kg)   LMP 11/02/2022 (Approximate)   SpO2 97%   BMI 23.05 kg/m    Wt Readings from Last 3 Encounters:  03/14/23 142 lb 12.8 oz (64.8 kg)  03/07/23 143  lb (64.9 kg)  05/07/22 145 lb (65.8 kg)    GEN: Well nourished, well developed in no acute distress NECK: No JVD; No carotid bruits CARDIAC: RRR, no murmurs, rubs, gallops RESPIRATORY:  Clear to auscultation without rales, wheezing or rhonchi  ABDOMEN: Soft, non-tender, non-distended EXTREMITIES:  No edema; No deformity   ASSESSMENT AND PLAN: .    Chest pain She reports chest pressure that is worse with movement and inspiration.  Also has a lot of anxiety, which may be contributing. EKG shows NSR with no ischemic changes. Risk factors for CAD include HTN, family history, smoking, dyslipidemia.  Lifestyle changes discussed in depth today.I will order a Cardiac CTA. We will see her back in 1 month.   Elevated LDL Recent labs showed total chol 183, HDL 39, TG 72, LDL 128. Lifestyle changes discussed today. Can re-check in a couple months.   Tobacco use She is smoking 1 ppd. Cessation discussed.   Alcohol use She quit 1 month ago, reports she was drinking daily.   HTN BP today 148/98. She reports she has been on BP medications in the past. I will start amlodipine 5mg  daily.     Dispo: Follow-up in 1 month  Signed, Amogh Komatsu David Stall, PA-C

## 2023-03-14 ENCOUNTER — Encounter: Payer: Self-pay | Admitting: Medical

## 2023-03-14 ENCOUNTER — Ambulatory Visit: Attending: Medical | Admitting: Medical

## 2023-03-14 VITALS — BP 148/98 | HR 92 | Ht 66.0 in | Wt 142.8 lb

## 2023-03-14 DIAGNOSIS — Z87898 Personal history of other specified conditions: Secondary | ICD-10-CM

## 2023-03-14 DIAGNOSIS — Z720 Tobacco use: Secondary | ICD-10-CM

## 2023-03-14 DIAGNOSIS — E785 Hyperlipidemia, unspecified: Secondary | ICD-10-CM

## 2023-03-14 DIAGNOSIS — R079 Chest pain, unspecified: Secondary | ICD-10-CM

## 2023-03-14 DIAGNOSIS — I10 Essential (primary) hypertension: Secondary | ICD-10-CM

## 2023-03-14 MED ORDER — AMLODIPINE BESYLATE 5 MG PO TABS: 5.0000 mg | ORAL_TABLET | Freq: Every day | ORAL | 3 refills | Status: AC

## 2023-03-14 MED ORDER — METOPROLOL TARTRATE 50 MG PO TABS: ORAL_TABLET | ORAL | 0 refills | Status: AC

## 2023-03-14 NOTE — Patient Instructions (Signed)
 Medication Instructions:  Your physician recommends the following medication changes.  TAKE: Metoprolol tartrate 100 mg once about 2 hours prior your CTA  START TAKING: Amlodipine 5 mg daily  *If you need a refill on your cardiac medications before your next appointment, please call your pharmacy*   Lab Work: Your provider would like for you to have following labs drawn today BMeT.   If you have labs (blood work) drawn today and your tests are completely normal, you will receive your results only by: MyChart Message (if you have MyChart) OR A paper copy in the mail If you have any lab test that is abnormal or we need to change your treatment, we will call you to review the results.   Testing/Procedures:  Cardiac CTA Your cardiac CT will be scheduled at the below location:   [facility] [address] [place], [place] [address] [phone]  If scheduled at [facility] or [facility], please arrive 15 mins early for check-in and test prep.  Please follow these instructions carefully (unless otherwise directed):  An IV will be required for this test and Nitroglycerin will be given.  Hold all erectile dysfunction medications at least 3 days (72 hrs) prior to test. (Ie viagra, cialis, sildenafil, tadalafil, etc)   On the Night Before the Test: Be sure to Drink plenty of water. Do not consume any caffeinated/decaffeinated beverages or chocolate 12 hours prior to your test. Do not take any antihistamines or cold medicines 12 hours prior to your test.  On the Day of the Test: Drink plenty of water until 1 hour prior to the test. Do not eat any food 1 hour prior to test. You may take your regular medications prior to the test.  Take metoprolol tartrate 100 mg (Lopressor) two hours prior to test. If you take Furosemide/Hydrochlorothiazide/Spironolactone/Chlorthalidone, please HOLD on the  morning of the test. Patients who wear a continuous glucose monitor MUST remove the device prior to scanning. FEMALES- please wear underwire-free bra if available, avoid dresses & tight clothing      After the Test: Drink plenty of water. After receiving IV contrast, you may experience a mild flushed feeling. This is normal. On occasion, you may experience a mild rash up to 24 hours after the test. This is not dangerous. If this occurs, you can take Benadryl 25 mg, Zyrtec, Claritin, or Allegra and increase your fluid intake. (Patients taking Tikosyn should avoid Benadryl, and may take Zyrtec, Claritin, or Allegra) If you experience trouble breathing, this can be serious. If it is severe call 911 IMMEDIATELY. If it is mild, please call our office.  We will call to schedule your test 2-4 weeks out understanding that some insurance companies will need an authorization prior to the service being performed.   For more information and frequently asked questions, please visit our website : [email or web address]  For non-scheduling related questions, please contact the cardiac imaging nurse navigator should you have any questions/concerns: Cardiac Imaging Nurse Navigators Direct Office Dial: [phone]   For scheduling needs, including cancellations and rescheduling, please call [place], [phone].  Follow-Up: At [facility], you and your health needs are our priority.  As part of our continuing mission to provide you with exceptional heart care, we have created designated Provider Care Teams.  These Care Teams include your primary Cardiologist (physician) and Advanced Practice Providers (APPs -  Physician Assistants and Nurse Practitioners) who all work together to provide you with  the care you need, when you need it.  Your next appointment:   1 month(s)  Provider:   You may see Cadence Fransico Michael, PA-C

## 2023-03-15 LAB — BASIC METABOLIC PANEL
BUN/Creatinine Ratio: 12 (ref 9–23)
BUN: 10 mg/dL (ref 6–24)
CO2: 23 mmol/L (ref 20–29)
Calcium: 9.4 mg/dL (ref 8.7–10.2)
Chloride: 101 mmol/L (ref 96–106)
Creatinine, Ser: 0.85 mg/dL (ref 0.57–1.00)
Glucose: 80 mg/dL (ref 70–99)
Potassium: 4.2 mmol/L (ref 3.5–5.2)
Sodium: 139 mmol/L (ref 134–144)
eGFR: 85 mL/min/{1.73_m2} (ref >59)

## 2023-03-21 ENCOUNTER — Encounter: Payer: Self-pay | Admitting: Family Medicine

## 2023-03-27 ENCOUNTER — Telehealth (HOSPITAL_COMMUNITY): Payer: Self-pay | Admitting: *Deleted

## 2023-03-27 NOTE — Telephone Encounter (Signed)

## 2023-03-28 ENCOUNTER — Other Ambulatory Visit: Payer: Self-pay | Admitting: Medical

## 2023-03-28 ENCOUNTER — Ambulatory Visit
Admission: RE | Admit: 2023-03-28 | Discharge: 2023-03-28 | Disposition: A | Discharge status: Home / Self Care | Source: Ambulatory Visit | Attending: Medical | Admitting: Medical

## 2023-03-28 DIAGNOSIS — R079 Chest pain, unspecified: Secondary | ICD-10-CM | POA: Insufficient documentation

## 2023-03-28 DIAGNOSIS — R931 Abnormal findings on diagnostic imaging of heart and coronary circulation: Secondary | ICD-10-CM

## 2023-03-28 DIAGNOSIS — I2510 Atherosclerotic heart disease of native coronary artery without angina pectoris: Secondary | ICD-10-CM

## 2023-03-28 MED ORDER — IOHEXOL 350 MG/ML SOLN
75.0000 mL | Freq: Once | INTRAVENOUS | Status: AC | PRN
  Administered 2023-03-28: 75 mL via INTRAVENOUS

## 2023-03-28 MED ORDER — DILTIAZEM HCL 25 MG/5ML IV SOLN: 10.0000 mg | INTRAVENOUS | Status: DC | PRN

## 2023-03-28 MED ORDER — SODIUM CHLORIDE 0.9 % IV SOLN: INTRAVENOUS | Status: DC

## 2023-03-28 MED ORDER — NITROGLYCERIN 0.4 MG SL SUBL
0.8000 mg | SUBLINGUAL_TABLET | Freq: Once | SUBLINGUAL | Status: AC
  Administered 2023-03-28: 0.8 mg via SUBLINGUAL

## 2023-03-28 MED ORDER — METOPROLOL TARTRATE 5 MG/5ML IV SOLN
10.0000 mg | Freq: Once | INTRAVENOUS | Status: AC | PRN
  Administered 2023-03-28: 5 mg via INTRAVENOUS

## 2023-03-28 NOTE — Progress Notes (Signed)
 Patient tolerated CT well. Drank water after. Vital signs stable encourage to drink water throughout day.Reasons explained and verbalized understanding. Ambulated steady gait.

## 2023-04-22 NOTE — Progress Notes (Unsigned)
 Cardiology Office Note:  .   Date:  04/23/2023  ID:  Nancy Ritter, DOB [birth year], MRN [id number] PCP: Adeline Hone, PA-C  [facility] Providers Cardiologist:  None {    History of Present Illness: .   Nancy Ritter is a 48 y.o. female has a h/o HTN, tobacco use, alcohol abuse, and nonobstructive CAD who presents as a new patient for chest pain follow-up.    In chart review it appears she has had admissions and ER visits for chest pain in the setting of elevated BP. She was admitted in 11/2020 for chest pain, hypertensive urgency and elevated troponin (19). She was seen by [facility] who recommended outpatient follow-up.    She smokes 1 ppd. She used to be an alcoholic (quit in 02/2023). No drug history. Mother had stroke and heart attack, diabetes.   She was seen 03/2023 by [facility] reporting chest pain that would occur with drinking alcohol, movement and inspiration. Cardiac CTA showed coronary calcium  score of 304, 99th percentile for age and sex matched, mod LAD stenosis of 50%. FFR showed no significant stenosis.   Today, the cardiac CTA was reviewed. She reports she is still having chest pain at rest. It happens occasionally. She also notes that it's hard to eat at times due to trouble swallowing. Sometimes she feels nausea as well. Bp is elevated, but she didn't take her BP meds today. She smokes 1 ppd. Still no alcohol use.    Studies Reviewed: .       Cardiac CTA 03/2023  IMPRESSION: 1. Coronary calcium  score of 304. This was 99th percentile for age and sex matched control.   2. Normal coronary origin with right dominance.   3. Moderate mid LAD stenosis (50%).   4. CAD-RADS 3. Moderate stenosis. Consider symptom-guided anti-ischemic pharmacotherapy as well as risk factor modification per guideline directed care.   5. Additional analysis with CT FFR will be submitted and reported separately.  FFR 1. Left Main:  No significant stenosis.   2. LAD: No significant  stenosis.  FFRct 0.85 3. LCX: No significant stenosis.  FFRct 0.96 4. RCA: No significant stenosis.  FFRct 0.93   IMPRESSION: 1.  CT FFR analysis didn't show any significant stenosis.       Physical Exam:   VS:  BP (!) 142/90 (BP Location: Left Arm, Patient Position: Sitting, Cuff Size: Normal)   Pulse 75   Resp 16   Ht 5\' 6"  (1.676 m)   Wt 132 lb (59.9 kg)   LMP 11/02/2022 (Approximate)   SpO2 99%   BMI 21.31 kg/m    Wt Readings from Last 3 Encounters:  04/23/23 132 lb (59.9 kg)  03/14/23 142 lb 12.8 oz (64.8 kg)  03/07/23 143 lb (64.9 kg)    GEN: Well nourished, well developed in no acute distress NECK: No JVD; No carotid bruits CARDIAC: RRR, no murmurs, rubs, gallops RESPIRATORY:  Clear to auscultation without rales, wheezing or rhonchi  ABDOMEN: Soft, non-tender, non-distended EXTREMITIES:  No edema; No deformity   ASSESSMENT AND PLAN: .    Chest pain Dysphagia/nausea Cardiac CTA showed moderate LAD disease of 50%, FFR was not significant. She reports persistent chest pain at rest, sounds more atypical. She is also reporting trouble swallowing and nausea. I will start a PPI and refer to GI. Can consider Imdur as well. Low suspicion pain is cardiac in nature.  Tobacco use She is smoking 1 ppd. Cessation advised.   Alcohol use She reports continued  cessation  HTN BP is elevated, but she did not take her meds this morning. Continue Amlodipine  5mg  daily.   HLD Ttoal chol 183, HDL 39, TG 72, LDL 128. ASCVD risk calculator 13.5%, recommending mod to high intensity statin. I will start Lipitor 40mg  daily. Re-check Lipids and LFTs in 4-6 weeks.        Dispo: Follow-up in 3 months  Signed, Omri Bertran Rebekah [place], PA-C

## 2023-04-23 ENCOUNTER — Telehealth: Payer: Self-pay

## 2023-04-23 ENCOUNTER — Encounter: Payer: Self-pay | Admitting: Medical

## 2023-04-23 ENCOUNTER — Ambulatory Visit: Admitting: Medical

## 2023-04-23 ENCOUNTER — Ambulatory Visit: Attending: Medical | Admitting: Medical

## 2023-04-23 VITALS — BP 142/90 | HR 75 | Resp 16 | Ht 66.0 in | Wt 132.0 lb

## 2023-04-23 DIAGNOSIS — R1310 Dysphagia, unspecified: Secondary | ICD-10-CM

## 2023-04-23 DIAGNOSIS — E782 Mixed hyperlipidemia: Secondary | ICD-10-CM

## 2023-04-23 DIAGNOSIS — Z87898 Personal history of other specified conditions: Secondary | ICD-10-CM

## 2023-04-23 DIAGNOSIS — Z720 Tobacco use: Secondary | ICD-10-CM | POA: Diagnosis not present

## 2023-04-23 DIAGNOSIS — Z79899 Other long term (current) drug therapy: Secondary | ICD-10-CM

## 2023-04-23 DIAGNOSIS — R079 Chest pain, unspecified: Secondary | ICD-10-CM | POA: Diagnosis not present

## 2023-04-23 DIAGNOSIS — I10 Essential (primary) hypertension: Secondary | ICD-10-CM

## 2023-04-23 MED ORDER — PANTOPRAZOLE SODIUM 20 MG PO TBEC: 20.0000 mg | DELAYED_RELEASE_TABLET | Freq: Every day | ORAL | 0 refills | Status: AC

## 2023-04-23 MED ORDER — ATORVASTATIN CALCIUM 40 MG PO TABS: 40.0000 mg | ORAL_TABLET | Freq: Every day | ORAL | 3 refills | Status: AC

## 2023-04-23 NOTE — Patient Instructions (Signed)
 Medication Instructions:  Your physician recommends the following medication changes.  START TAKING: Protonix  20 mg daily  *If you need a refill on your cardiac medications before your next appointment, please call your pharmacy*   Follow-Up: At [facility], you and your health needs are our priority.  As part of our continuing mission to provide you with exceptional heart care, our providers are all part of one team.  This team includes your primary Cardiologist (physician) and Advanced Practice Providers or APPs (Physician Assistants and Nurse Practitioners) who all work together to provide you with the care you need, when you need it.  Your next appointment:   3 month(s)  Provider:   Cadence Gennaro Khat, PA-C    We recommend signing up for the patient portal called "MyChart".  Sign up information is provided on this After Visit Summary.  MyChart is used to connect with patients for Virtual Visits (Telemedicine).  Patients are able to view lab/test results, encounter notes, upcoming appointments, etc.  Non-urgent messages can be sent to your provider as well.   To learn more about what you can do with MyChart, go to [email or web address].   Other Instructions GI referral placed

## 2023-04-23 NOTE — Telephone Encounter (Signed)
 Per Cadence, she would like for pt to start Lipitor 40 mg by mouth daily and have lipid panel completed in 4-6 weeks. Pt made aware and verbalized understanding.

## 2023-06-10 ENCOUNTER — Encounter: Admitting: Family Medicine

## 2023-06-10 DIAGNOSIS — I10 Essential (primary) hypertension: Secondary | ICD-10-CM

## 2023-06-10 DIAGNOSIS — Z1211 Encounter for screening for malignant neoplasm of colon: Secondary | ICD-10-CM

## 2023-06-10 DIAGNOSIS — Z124 Encounter for screening for malignant neoplasm of cervix: Secondary | ICD-10-CM

## 2023-06-10 DIAGNOSIS — F17200 Nicotine dependence, unspecified, uncomplicated: Secondary | ICD-10-CM

## 2023-06-10 DIAGNOSIS — N951 Menopausal and female climacteric states: Secondary | ICD-10-CM

## 2023-06-10 DIAGNOSIS — Z0000 Encounter for general adult medical examination without abnormal findings: Secondary | ICD-10-CM

## 2023-06-20 ENCOUNTER — Encounter: Payer: Self-pay | Admitting: Family Medicine

## 2023-06-20 ENCOUNTER — Other Ambulatory Visit (HOSPITAL_COMMUNITY)
Admission: RE | Admit: 2023-06-20 | Discharge: 2023-06-20 | Disposition: A | Discharge status: Home / Self Care | Source: Ambulatory Visit | Attending: Family Medicine | Admitting: Family Medicine

## 2023-06-20 ENCOUNTER — Ambulatory Visit (INDEPENDENT_AMBULATORY_CARE_PROVIDER_SITE_OTHER): Admitting: Family Medicine

## 2023-06-20 VITALS — BP 138/76 | HR 88 | Resp 16 | Ht 66.0 in | Wt 124.0 lb

## 2023-06-20 DIAGNOSIS — E785 Hyperlipidemia, unspecified: Secondary | ICD-10-CM

## 2023-06-20 DIAGNOSIS — F419 Anxiety disorder, unspecified: Secondary | ICD-10-CM

## 2023-06-20 DIAGNOSIS — I10 Essential (primary) hypertension: Secondary | ICD-10-CM

## 2023-06-20 DIAGNOSIS — Z1211 Encounter for screening for malignant neoplasm of colon: Secondary | ICD-10-CM

## 2023-06-20 DIAGNOSIS — Z124 Encounter for screening for malignant neoplasm of cervix: Secondary | ICD-10-CM

## 2023-06-20 DIAGNOSIS — Z1231 Encounter for screening mammogram for malignant neoplasm of breast: Secondary | ICD-10-CM

## 2023-06-20 DIAGNOSIS — Z113 Encounter for screening for infections with a predominantly sexual mode of transmission: Secondary | ICD-10-CM

## 2023-06-20 DIAGNOSIS — F32A Depression, unspecified: Secondary | ICD-10-CM

## 2023-06-20 DIAGNOSIS — N951 Menopausal and female climacteric states: Secondary | ICD-10-CM | POA: Diagnosis not present

## 2023-06-20 DIAGNOSIS — Z0000 Encounter for general adult medical examination without abnormal findings: Secondary | ICD-10-CM | POA: Diagnosis not present

## 2023-06-20 DIAGNOSIS — F1021 Alcohol dependence, in remission: Secondary | ICD-10-CM

## 2023-06-20 NOTE — Progress Notes (Unsigned)
 Patient: Nancy Ritter, Female    DOB: [birth year], 48 y.o.   MRN: [id number] Adeline Hone, PA-C Visit Date: 06/21/2023  Today's Provider: Adeline Hone, PA-C   Chief Complaint  Patient presents with   Annual Exam   Subjective:   Annual physical exam:  Nancy Ritter is a 48 y.o. female who presents today for complete physical exam:  Exercise/Activity:  no formal exercise right now Diet/nutrition:  whatever she can eat/get, trying to maintain weight Sleep:  no concerns  SDOH Screenings   Food Insecurity: No Food Insecurity (03/07/2023)  Housing: Unknown (03/07/2023)  Transportation Needs: No Transportation Needs (03/07/2023)  Utilities: Not At Risk (03/07/2023)  Alcohol Screen: Low Risk  (06/20/2023)  Depression (PHQ2-9): Medium Risk (06/20/2023)  Financial Resource Strain: Low Risk  (03/07/2023)  Physical Activity: Inactive (03/07/2023)  Social Connections: Moderately Integrated (03/07/2023)  Stress: No Stress Concern Present (03/07/2023)  Tobacco Use: High Risk (06/20/2023)  Health Literacy: Adequate Health Literacy (03/07/2023)    USPSTF grade A and B recommendations - reviewed and addressed today  Depression:  Phq 9 completed today by patient, was reviewed by me with patient in the room PHQ score is positive, pt feels overall having some stress, but doing well    06/20/2023   10:48 AM 03/07/2023   10:21 AM  PHQ 2/9 Scores  PHQ - 2 Score 1 0  PHQ- 9 Score 5 1      06/20/2023   10:48 AM 03/07/2023   10:21 AM  Depression screen PHQ 2/9  Decreased Interest 0 0  Down, Depressed, Hopeless 1 0  PHQ - 2 Score 1 0  Altered sleeping 0 1  Tired, decreased energy 0 0  Change in appetite 3 0  Feeling bad or failure about yourself  1 0  Trouble concentrating 0 0  Moving slowly or fidgety/restless 0 0  Suicidal thoughts 0 0  PHQ-9 Score 5 1    Alcohol screening: Flowsheet Row Office Visit from 06/20/2023 in [facility]  AUDIT-C Score 0    Immunizations and  Health Maintenance: Health Maintenance  Topic Date Due   MAMMOGRAM  Never done   COVID-19 Vaccine (1) 06/23/2023 (Originally 06/06/1980)   DTaP/Tdap/Td (2 - Tdap) 03/06/2024 (Originally 11/03/2016)   Pneumococcal Vaccine 24-78 Years old (1 of 2 - PCV) 03/06/2024 (Originally 06/07/1994)   Colonoscopy  06/18/2024 (Originally 06/06/2020)   INFLUENZA VACCINE  08/02/2023   Cervical Cancer Screening (HPV/Pap Cotest)  06/20/2026   Hepatitis C Screening  Completed   HIV Screening  Completed   HPV VACCINES  Aged Out   Meningococcal B Vaccine  Aged Out     Hep C Screening: done  STD testing and prevention (HIV/chl/gon/syphilis):  will screen today with pap  Intimate partner violence:she reports she is safe at home  Sexual History/Pain during Intercourse: Significant Other, no pain  Menstrual History/LMP/Abnormal Bleeding:  irregular, lighter, coming every few months Patient's last menstrual period was 04/02/2023 (approximate).  Incontinence Symptoms:  some stress incontinence 6-8 m ago, very mild sx  Breast cancer:  due Last Mammogram: *see HM list above  Cervical cancer screening: due today  Osteoporosis:   Discussion on osteoporosis per age, including high calcium  and vitamin D supplementation, weight bearing exercises   Skin cancer:  Hx of skin CA -  NO Discussed atypical lesions   Colorectal cancer:   Colonoscopy is due   Discussed concerning signs and sx of CRC, pt denies change in bowels  Lung cancer:  Low Dose CT Chest recommended if Age 79-80 years, 20 pack-year currently smoking OR have quit w/in 15years. Patient does not qualify.    Social History   Tobacco Use   Smoking status: Every Day    Current packs/day: 1.00    Average packs/day: 1 pack/day for 6.5 years (6.5 ttl pk-yrs)    Types: Cigarettes    Start date: 01/01/2017   Smokeless tobacco: Never   Tobacco comments:    1 PPD, cutting back to 1/2 ppd, smoked when she was younger 1998-99, stopped for pregnancy,  started again after pregnancy 99-2001, 2001-2008, 2019-current  Vaping Use   Vaping status: Never Used  Substance Use Topics   Alcohol use: Not Currently    Comment: prior heavy drinker - liquor, still working on abstaining   Drug use: Yes    Types: Marijuana     Flowsheet Row Office Visit from 06/20/2023 in [facility]  AUDIT-C Score 0    Family History  Problem Relation Age of Onset   Stroke Mother    Diabetes Mother    Glaucoma Mother    Hypertension Father    Diabetes Sister    Varicose Veins Sister    Hypertension Brother    Diabetes Brother      Blood pressure/Hypertension: BP Readings from Last 3 Encounters:  06/20/23 138/76  04/23/23 (!) 142/90  03/28/23 128/88    Weight/Obesity: Wt Readings from Last 3 Encounters:  06/20/23 124 lb (56.2 kg)  04/23/23 132 lb (59.9 kg)  03/14/23 142 lb 12.8 oz (64.8 kg)   BMI Readings from Last 3 Encounters:  06/20/23 20.01 kg/m  04/23/23 21.31 kg/m  03/14/23 23.05 kg/m     Lipids:  Lab Results  Component Value Date   CHOL 134 06/20/2023   CHOL 183 03/07/2023   CHOL 185 11/26/2020   Lab Results  Component Value Date   HDL 47 (L) 06/20/2023   HDL 39 (L) 03/07/2023   HDL 44 11/26/2020   Lab Results  Component Value Date   LDLCALC 73 06/20/2023   LDLCALC 128 (H) 03/07/2023   LDLCALC 126 (H) 11/26/2020   Lab Results  Component Value Date   TRIG 67 06/20/2023   TRIG 72 03/07/2023   TRIG 73 11/26/2020   Lab Results  Component Value Date   CHOLHDL 2.9 06/20/2023   CHOLHDL 4.7 03/07/2023   CHOLHDL 4.2 11/26/2020   No results found for: LDLDIRECT Based on the results of lipid panel his/her cardiovascular risk factor ( using Poole Cohort )  in the next 10 years is: The 10-year ASCVD risk score (Arnett DK, et al., 2019) is: 7.1%   Values used to calculate the score:     Age: 3 years     Clincally relevant sex: Female     Is Non-Hispanic African American: Yes     Diabetic:  No     Tobacco smoker: Yes     Systolic Blood Pressure: 138 mmHg     Is BP treated: Yes     HDL Cholesterol: 47 mg/dL     Total Cholesterol: 134 mg/dL  Glucose:  Glucose  Date Value Ref Range Status  03/14/2023 80 70 - 99 mg/dL Final   Glucose, Bld  Date Value Ref Range Status  06/20/2023 80 65 - 99 mg/dL Final    Comment:    .            Fasting reference interval .   03/07/2023 80 65 - 99 mg/dL  Final    Comment:    .            Fasting reference interval .   05/07/2022 109 (H) 70 - 99 mg/dL Final    Comment:    Glucose reference range applies only to samples taken after fasting for at least 8 hours.     Social History       Social History   Socioeconomic History   Marital status: Significant Other    Spouse name: Not on file   Number of children: 3   Years of education: Not on file   Highest education level: Not on file  Occupational History   Not on file  Tobacco Use   Smoking status: Every Day    Current packs/day: 1.00    Average packs/day: 1 pack/day for 6.5 years (6.5 ttl pk-yrs)    Types: Cigarettes    Start date: 01/01/2017   Smokeless tobacco: Never   Tobacco comments:    1 PPD, cutting back to 1/2 ppd, smoked when she was younger 1998-99, stopped for pregnancy, started again after pregnancy 99-2001, 2001-2008, 2019-current  Vaping Use   Vaping status: Never Used  Substance and Sexual Activity   Alcohol use: Not Currently    Comment: prior heavy drinker - liquor, still working on abstaining   Drug use: Yes    Types: Marijuana   Sexual activity: Yes    Birth control/protection: None  Other Topics Concern   Not on file  Social History Narrative   Boyfriend Autry Legions live together   Daughter 16 living at home   Social Drivers of Health   Financial Resource Strain: Low Risk  (03/07/2023)   Overall Financial Resource Strain (CARDIA)    Difficulty of Paying Living Expenses: Not hard at all  Food Insecurity: No Food Insecurity (03/07/2023)   Hunger  Vital Sign    Worried About Running Out of Food in the Last Year: Never true    Ran Out of Food in the Last Year: Never true  Transportation Needs: No Transportation Needs (03/07/2023)   PRAPARE - Administrator, Civil Service (Medical): No    Lack of Transportation (Non-Medical): No  Physical Activity: Inactive (03/07/2023)   Exercise Vital Sign    Days of Exercise per Week: 0 days    Minutes of Exercise per Session: 0 min  Stress: No Stress Concern Present (03/07/2023)   [employer] of Occupational Health - Occupational Stress Questionnaire    Feeling of Stress : Not at all  Social Connections: Moderately Integrated (03/07/2023)   Social Connection and Isolation Panel    Frequency of Communication with Friends and Family: More than three times a week    Frequency of Social Gatherings with Friends and Family: Three times a week    Attends Religious Services: More than 4 times per year    Active Member of Clubs or Organizations: No    Attends Banker Meetings: Never    Marital Status: Married    Family History        Family History  Problem Relation Age of Onset   Stroke Mother    Diabetes Mother    Glaucoma Mother    Hypertension Father    Diabetes Sister    Varicose Veins Sister    Hypertension Brother    Diabetes Brother     Patient Active Problem List   Diagnosis Date Noted   Current smoker 03/07/2023   Perimenopausal 03/07/2023   Anxiety and  depression 03/07/2023   Alcoholism in remission (HCC) 03/07/2023   Chest pain 11/25/2020   Essential hypertension 11/25/2020    History reviewed. No pertinent surgical history.   Current Outpatient Medications:    amLODipine  (NORVASC ) 5 MG tablet, Take 1 tablet (5 mg total) by mouth daily., Disp: 34 tablet, Rfl: 3   atorvastatin  (LIPITOR) 40 MG tablet, Take 1 tablet (40 mg total) by mouth daily., Disp: 90 tablet, Rfl: 3   fluticasone  (FLONASE ) 50 MCG/ACT nasal spray, Place 2 sprays into both  nostrils daily. (Patient taking differently: Place 2 sprays into both nostrils as needed.), Disp: 16 g, Rfl: 6   levocetirizine (XYZAL ) 5 MG tablet, Take 1 tablet (5 mg total) by mouth every evening. As needed for nasal/sinus symptoms or post nasal drip, Disp: 30 tablet, Rfl: 5   metoprolol  tartrate (LOPRESSOR ) 50 MG tablet, TAKE 1 TABLET 2 HR PRIOR TO CARDIAC PROCEDURE, Disp: 2 tablet, Rfl: 0   pantoprazole  (PROTONIX ) 20 MG tablet, Take 1 tablet (20 mg total) by mouth daily., Disp: 90 tablet, Rfl: 0   fluconazole (DIFLUCAN) 150 MG tablet, Take 1 tablet (150 mg total) by mouth every 3 (three) days as needed (for vaginal iyeast infection)., Disp: 4 tablet, Rfl: 0   metroNIDAZOLE (FLAGYL) 500 MG tablet, Take 1 tablet (500 mg total) by mouth 2 (two) times daily for 7 days., Disp: 14 tablet, Rfl: 0  No Known Allergies  Patient Care Team: Jayma Volpi, PA-C as PCP - General (Family Medicine)   Chart Review: I personally reviewed active problem list, medication list, allergies, family history, social history, health maintenance, notes from last encounter, lab results, imaging with the patient/caregiver today.   Review of Systems  Constitutional: Negative.   HENT: Negative.    Eyes: Negative.   Respiratory: Negative.    Cardiovascular: Negative.   Gastrointestinal: Negative.   Endocrine: Negative.   Genitourinary: Negative.   Musculoskeletal: Negative.   Skin: Negative.   Allergic/Immunologic: Negative.   Neurological: Negative.   Hematological: Negative.   Psychiatric/Behavioral: Negative.    All other systems reviewed and are negative.         Objective:   Vitals:  Vitals:   06/20/23 1048 06/20/23 1115  BP: 138/76   Pulse: (!) 108 88  Resp: 16   SpO2: 97%   Weight: 124 lb (56.2 kg)   Height: 5' 6 (1.676 m)     Body mass index is 20.01 kg/m.  Physical Exam Vitals and nursing note reviewed. Exam conducted with a chaperone present.  Constitutional:      General: She is  not in acute distress.    Appearance: Normal appearance. She is well-developed and normal weight. She is not ill-appearing, toxic-appearing or diaphoretic.  HENT:     Head: Normocephalic and atraumatic.     Right Ear: External ear normal.     Left Ear: External ear normal.     Nose: Nose normal.     Mouth/Throat:     Mouth: Mucous membranes are moist.     Pharynx: Oropharynx is clear. Uvula midline. No oropharyngeal exudate or posterior oropharyngeal erythema.   Eyes:     General: Lids are normal. No scleral icterus.       Right eye: No discharge.        Left eye: No discharge.     Conjunctiva/sclera: Conjunctivae normal.     Pupils: Pupils are equal, round, and reactive to light.   Neck:     Trachea: Phonation normal. No tracheal deviation.  Cardiovascular:     Rate and Rhythm: Normal rate and regular rhythm.     Pulses: Normal pulses.          Radial pulses are 2+ on the right side and 2+ on the left side.       Posterior tibial pulses are 2+ on the right side and 2+ on the left side.     Heart sounds: Normal heart sounds. No murmur heard.    No friction rub. No gallop.  Pulmonary:     Effort: Pulmonary effort is normal. No respiratory distress.     Breath sounds: Normal breath sounds. No stridor. No wheezing, rhonchi or rales.  Chest:     Chest wall: No tenderness.  Abdominal:     General: Bowel sounds are normal. There is no distension.     Palpations: Abdomen is soft.     Tenderness: There is no abdominal tenderness. There is no guarding or rebound.  Genitourinary:    General: Normal vulva.     Vagina: Vaginal discharge present. No erythema, tenderness, bleeding or lesions.     Cervix: Normal. No cervical motion tenderness, friability, lesion, erythema, cervical bleeding or eversion.     Uterus: Normal.      Adnexa: Right adnexa normal and left adnexa normal.   Musculoskeletal:        General: No deformity. Normal range of motion.     Cervical back: Normal range of  motion and neck supple.  Lymphadenopathy:     Cervical: No cervical adenopathy.   Skin:    General: Skin is warm and dry.     Capillary Refill: Capillary refill takes less than 2 seconds.     Coloration: Skin is not pale.     Findings: No rash.   Neurological:     Mental Status: She is alert and oriented to person, place, and time.     Motor: No abnormal muscle tone.     Coordination: Coordination normal.     Gait: Gait normal.   Psychiatric:        Mood and Affect: Mood normal.        Speech: Speech normal.        Behavior: Behavior normal.       Fall Risk:    06/20/2023   10:48 AM 03/07/2023   10:20 AM  Fall Risk   Falls in the past year? 1 1  Number falls in past yr: 0 0  Injury with Fall? 0 1  Risk for fall due to : No Fall Risks No Fall Risks  Follow up Falls prevention discussed Falls prevention discussed    Functional Status Survey: Is the patient deaf or have difficulty hearing?: Yes Does the patient have difficulty seeing, even when wearing glasses/contacts?: No Does the patient have difficulty concentrating, remembering, or making decisions?: No Does the patient have difficulty walking or climbing stairs?: No Does the patient have difficulty dressing or bathing?: No Does the patient have difficulty doing errands alone such as visiting a doctor's office or shopping?: No   Assessment & Plan:    CPE completed today  USPSTF grade A and B recommendations reviewed with patient; age-appropriate recommendations, preventive care, screening tests, etc discussed and encouraged; healthy living encouraged; see AVS for patient education given to patient  Discussed importance of 150 minutes of physical activity weekly, AHA exercise recommendations given to pt in AVS/handout  Discussed importance of healthy diet:  eating lean meats and proteins, avoiding trans fats and saturated fats,  avoid simple sugars and excessive carbs in diet, eat 6 servings of fruit/vegetables  daily and drink plenty of water and avoid sweet beverages.    Recommended pt to do annual eye exam and routine dental exams/cleanings  Depression, alcohol, fall screening completed as documented above and per flowsheets  Advance Care planning information and packet discussed and offered today, encouraged pt to discuss with family members/spouse/partner/friends and complete Advanced directive packet and bring copy to office   Reviewed Health Maintenance: Health Maintenance  Topic Date Due   MAMMOGRAM  Never done   COVID-19 Vaccine (1) 06/23/2023 (Originally 06/06/1980)   DTaP/Tdap/Td (2 - Tdap) 03/06/2024 (Originally 11/03/2016)   Pneumococcal Vaccine 75-52 Years old (1 of 2 - PCV) 03/06/2024 (Originally 06/07/1994)   Colonoscopy  06/18/2024 (Originally 06/06/2020)   INFLUENZA VACCINE  08/02/2023   Cervical Cancer Screening (HPV/Pap Cotest)  06/20/2026   Hepatitis C Screening  Completed   HIV Screening  Completed   HPV VACCINES  Aged Out   Meningococcal B Vaccine  Aged Out    Immunizations: Immunization History  Administered Date(s) Administered   Td 11/04/2006   Vaccines:  Pneumonia: recommended due to smoking educated and discussed with patient.     ICD-10-CM   1. Well adult exam  Z00.00 Comprehensive metabolic panel with GFR    Lipid panel    2. Essential hypertension  I10    BP slightly elevated this am, but she didn't take meds and hasn't slept, no change, will continue to monitor    3. Perimenopausal  N95.1     4. Anxiety and depression  F41.9    F32.A    phq 9 score mildly postiive    5. Alcoholism in remission (HCC)  F10.21    doing well with abstaining    6. Cervical cancer screening  Z12.4 Cytology - PAP    7. Encounter for screening mammogram for malignant neoplasm of breast  Z12.31 MM 3D SCREENING MAMMOGRAM BILATERAL BREAST    8. Screen for STD (sexually transmitted disease)  Z11.3 Cervicovaginal ancillary only    9. Screening for malignant neoplasm of colon   Z12.11 Ambulatory referral to Gastroenterology    10. Hyperlipidemia, unspecified hyperlipidemia type  E78.5    started statin with cardiology and is taking it daily and doing well w/o SE or concerns, we will recheck labs and LFTs today      Return in about 6 months (around 12/20/2023) for HTN HLD.     Adeline Hone, PA-C 06/21/23 10:59 AM  [facility]

## 2023-06-21 ENCOUNTER — Ambulatory Visit: Payer: Self-pay | Admitting: Family Medicine

## 2023-06-21 ENCOUNTER — Encounter: Payer: Self-pay | Admitting: Family Medicine

## 2023-06-21 DIAGNOSIS — A5901 Trichomonal vulvovaginitis: Secondary | ICD-10-CM

## 2023-06-21 DIAGNOSIS — B379 Candidiasis, unspecified: Secondary | ICD-10-CM

## 2023-06-21 DIAGNOSIS — B9689 Other specified bacterial agents as the cause of diseases classified elsewhere: Secondary | ICD-10-CM

## 2023-06-21 DIAGNOSIS — E785 Hyperlipidemia, unspecified: Secondary | ICD-10-CM | POA: Insufficient documentation

## 2023-06-21 LAB — CYTOLOGY - PAP
Comment: NEGATIVE
Diagnosis: NEGATIVE
High risk HPV: NEGATIVE

## 2023-06-21 LAB — CERVICOVAGINAL ANCILLARY ONLY
Bacterial Vaginitis (gardnerella): POSITIVE — AB
Candida Glabrata: NEGATIVE
Candida Vaginitis: POSITIVE — AB
Chlamydia: NEGATIVE
Comment: NEGATIVE
Comment: NEGATIVE
Comment: NEGATIVE
Comment: NEGATIVE
Comment: NEGATIVE
Comment: NORMAL
Neisseria Gonorrhea: NEGATIVE
Trichomonas: POSITIVE — AB

## 2023-06-21 LAB — COMPREHENSIVE METABOLIC PANEL WITH GFR
AG Ratio: 1.4 (calc) (ref 1.0–2.5)
ALT: 11 U/L (ref 6–29)
AST: 14 U/L (ref 10–35)
Albumin: 4.2 g/dL (ref 3.6–5.1)
Alkaline phosphatase (APISO): 65 U/L (ref 31–125)
BUN: 8 mg/dL (ref 7–25)
CO2: 29 mmol/L (ref 20–32)
Calcium: 9.6 mg/dL (ref 8.6–10.2)
Chloride: 103 mmol/L (ref 98–110)
Creat: 0.7 mg/dL (ref 0.50–0.99)
Globulin: 3 g/dL (ref 1.9–3.7)
Glucose, Bld: 80 mg/dL (ref 65–99)
Potassium: 4.3 mmol/L (ref 3.5–5.3)
Sodium: 139 mmol/L (ref 135–146)
Total Bilirubin: 0.5 mg/dL (ref 0.2–1.2)
Total Protein: 7.2 g/dL (ref 6.1–8.1)
eGFR: 107 mL/min/{1.73_m2} (ref >=60)

## 2023-06-21 LAB — LIPID PANEL
Cholesterol: 134 mg/dL (ref <200)
HDL: 47 mg/dL — ABNORMAL LOW (ref >=50)
LDL Cholesterol (Calc): 73 mg/dL
Non-HDL Cholesterol (Calc): 87 mg/dL (ref <130)
Total CHOL/HDL Ratio: 2.9 (calc) (ref <5.0)
Triglycerides: 67 mg/dL (ref <150)

## 2023-06-21 MED ORDER — FLUCONAZOLE 150 MG PO TABS: 150.0000 mg | ORAL_TABLET | ORAL | 0 refills | Status: AC | PRN

## 2023-06-21 MED ORDER — METRONIDAZOLE 500 MG PO TABS: 500.0000 mg | ORAL_TABLET | Freq: Two times a day (BID) | ORAL | 0 refills | Status: AC

## 2023-07-23 ENCOUNTER — Ambulatory Visit: Payer: Self-pay | Attending: Medical | Admitting: Medical

## 2023-07-23 NOTE — Progress Notes (Deleted)
  Cardiology Office Note   Date:  07/23/2023  ID:  Nancy Ritter, DOB [birth year], MRN [id number] PCP: Leavy Mole, PA-C  [facility] Providers Cardiologist:  None { Click to update primary MD,subspecialty MD or APP then REFRESH:1}    History of Present Illness Nancy Ritter is a 48 y.o. female  has a h/o HTN, tobacco use, alcohol abuse, and nonobstructive CAD who presents for follow-up of chest pain.   In chart review it appears she has had admissions and ER visits for chest pain in the setting of elevated BP. She was admitted in 11/2020 for chest pain, hypertensive urgency and elevated troponin (19). She was seen by [facility], who recommended outpatient follow-up.    She smokes 1 ppd. She used to be an alcoholic (quit in 02/2023). No drug history. Mother had stroke and heart attack, diabetes.    She was seen 03/2023 by [facility] reporting chest pain that would occur with drinking alcohol, movement and inspiration. Cardiac CTA showed coronary calcium  score of 304, 99th percentile for age and sex matched, mod LAD stenosis of 50%. FFR showed no significant stenosis.   Patient was last seen 04/23/2023 for follow-up of chest pain.  She reported persistent chest pain at rest, overall more atypical.  She was started on a PPI and referred to GI.  Today,  ROS: ***  Studies Reviewed      *** Risk Assessment/Calculations {Does this patient have ATRIAL FIBRILLATION?:[phone]} No BP recorded.  {Refresh Note OR Click here to enter BP  :1}***       Physical Exam VS:  There were no vitals taken for this visit.       Wt Readings from Last 3 Encounters:  06/20/23 124 lb (56.2 kg)  04/23/23 132 lb (59.9 kg)  03/14/23 142 lb 12.8 oz (64.8 kg)    GEN: Well nourished, well developed in no acute distress NECK: No JVD; No carotid bruits CARDIAC: ***RRR, no murmurs, rubs, gallops RESPIRATORY:  Clear to auscultation without rales, wheezing or rhonchi  ABDOMEN: Soft, non-tender,  non-distended EXTREMITIES:  No edema; No deformity   ASSESSMENT AND PLAN ***    {Are you ordering a CV Procedure (e.g. stress test, cath, DCCV, TEE, etc)?   Press F2        :[phone]}  Dispo: ***  Signed, Aerica Rincon VEAR Fishman, PA-C

## 2023-12-20 ENCOUNTER — Ambulatory Visit: Admitting: Family Medicine
